# Patient Record
Sex: Female | Born: 1965 | Hispanic: Yes | Marital: Married | Smoking: Never smoker
Health system: Southern US, Community
[De-identification: ages and names within clinical notes are randomized; demographics above are authoritative.]

## PROBLEM LIST (undated history)

## (undated) DIAGNOSIS — N950 Postmenopausal bleeding: Secondary | ICD-10-CM

## (undated) DIAGNOSIS — K219 Gastro-esophageal reflux disease without esophagitis: Secondary | ICD-10-CM

## (undated) DIAGNOSIS — Z8719 Personal history of other diseases of the digestive system: Secondary | ICD-10-CM

## (undated) DIAGNOSIS — N840 Polyp of corpus uteri: Secondary | ICD-10-CM

## (undated) HISTORY — PX: COLONOSCOPY WITH ESOPHAGOGASTRODUODENOSCOPY (EGD): SHX5779

## (undated) HISTORY — PX: BREAST BIOPSY: SHX20

---

## 2008-03-16 ENCOUNTER — Ambulatory Visit: Payer: Self-pay

## 2008-04-07 ENCOUNTER — Ambulatory Visit: Payer: Self-pay | Admitting: General Surgery

## 2008-04-12 ENCOUNTER — Ambulatory Visit: Payer: Self-pay | Admitting: General Surgery

## 2008-04-12 HISTORY — PX: BREAST EXCISIONAL BIOPSY: SUR124

## 2009-08-09 ENCOUNTER — Ambulatory Visit: Payer: Self-pay | Admitting: Family Medicine

## 2013-04-15 ENCOUNTER — Ambulatory Visit: Payer: Self-pay

## 2015-10-02 ENCOUNTER — Encounter: Payer: Self-pay | Admitting: Emergency Medicine

## 2015-10-02 ENCOUNTER — Ambulatory Visit
Admission: EM | Admit: 2015-10-02 | Discharge: 2015-10-02 | Disposition: A | Discharge status: Home / Self Care | Payer: BLUE CROSS/BLUE SHIELD | Attending: Internal Medicine | Admitting: Internal Medicine

## 2015-10-02 DIAGNOSIS — A09 Infectious gastroenteritis and colitis, unspecified: Secondary | ICD-10-CM | POA: Diagnosis not present

## 2015-10-02 DIAGNOSIS — R197 Diarrhea, unspecified: Secondary | ICD-10-CM

## 2015-10-02 MED ORDER — SULFAMETHOXAZOLE-TRIMETHOPRIM 800-160 MG PO TABS: 1.0000 | ORAL_TABLET | Freq: Two times a day (BID) | ORAL | Status: AC

## 2015-10-02 NOTE — ED Provider Notes (Addendum)
CSN: [id number]     Arrival date & time 10/02/15  1004 History   First MD Initiated Contact with Patient 10/02/15 1129     Chief Complaint  Patient presents with  . Diarrhea   HPI  Patient is a 49 year old lady who presents today with an 9 day history of diarrhea, persistent after eating some potato salad from the [employer] at Thanksgiving. It tasted off to her, so no one else ate any, and she only ate a bite or 2. She's continuing to have 3 or 4 watery stools a day, no blood. Crampy abdominal discomfort, relieved by bowel movement. Mild upper epigastric discomfort, with nausea. No vomiting. Not having incontinence or accidents on the way to the bathroom. Some headache associated with the abdominal discomfort. No cough. No urinary frequency, no dysuria, no unusual vaginal discharge or bleeding. Mild low back discomfort associated with abdominal cramping. No fever. History reviewed. No pertinent past medical history. History reviewed. No pertinent past surgical history. Family history:  hypertension Social History  Substance Use Topics  . Smoking status: Never Smoker   . Smokeless tobacco: None  . Alcohol Use: No    Review of Systems  All other systems reviewed and are negative.   Allergies  Review of patient's allergies indicates no known allergies.  Home Medications   Prior to Admission medications   Medication Sig Start Date End Date Taking? Authorizing Provider  bismuth subsalicylate (PEPTO BISMOL) 262 MG/15ML suspension Take 30 mLs by mouth every 6 (six) hours as needed.   Yes Historical Provider, MD           BP 137/86 mmHg  Pulse 57  Temp(Src) 98.1 F (36.7 C) (Oral)  Resp 16  Ht 5\' 2"  (1.575 m)  Wt 126 lb (57.153 kg)  BMI 23.04 kg/m2  SpO2 100%  LMP 09/18/2015 (Approximate)   Physical Exam  Constitutional: She is oriented to person, place, and time. No distress.  Alert, nicely groomed  HENT:  Head: Atraumatic.  Eyes:  Conjugate gaze, no eye  redness/drainage  Neck: Neck supple.  Cardiovascular: Normal rate and regular rhythm.   Pulmonary/Chest: No respiratory distress. She has no wheezes. She has no rales.  Lungs clear, symmetric breath sounds  Abdominal: Soft. She exhibits no distension. There is no tenderness. There is no rebound and no guarding.  Musculoskeletal: Normal range of motion. She exhibits no edema.  No leg swelling  Neurological: She is alert and oriented to person, place, and time.  Skin: Skin is warm and dry.  No cyanosis  Nursing note and vitals reviewed.   ED Course  Procedures (including critical care time)   MDM   1. Diarrhea of presumed infectious origin    Discharge Medication List as of 10/02/2015 11:46 AM    START taking these medications   Details  sulfamethoxazole-trimethoprim (BACTRIM DS,SEPTRA DS) 800-160 MG tablet Take 1 tablet by mouth 2 (two) times daily., Starting 10/02/2015, Until Sun 10/09/15, Normal       Stool sample for culture requested; put in as a future order. Discussed with patient collecting the sample before starting on the Bactrim. Recheck if not starting to improve in a few days, for bloody diarrhea, or for worsening abdominal pain or fever greater than 100.5. Anticipate gradual improvement in diarrhea over the next 2-3 weeks.    Eustace MooreLaura W Budzik, MD 10/02/15 1154  Eustace MooreLaura W Hare, MD 10/03/15 [phone]

## 2015-10-02 NOTE — Discharge Instructions (Signed)
Please bring in a stool sample for culture for food poisoning germs.  After collecting the sample, you can start on the prescription for bactrim (antibiotic) which was sent to the [facility] in [place]. Recheck if increasing diarrhea (frequency, or increased volume/wateriness), for bloody diarrhea, or for new fever >100.5 or worsening abdominal pain.

## 2015-10-02 NOTE — ED Notes (Signed)
Pt reports stomach pain and diarrhea since last week Friday, got better and then returned. Also headache. Nausea but no vomiting.

## 2015-10-03 DIAGNOSIS — A09 Infectious gastroenteritis and colitis, unspecified: Secondary | ICD-10-CM | POA: Diagnosis not present

## 2015-10-07 LAB — STOOL CULTURE

## 2015-10-10 NOTE — ED Notes (Signed)
Patient notified no pathogens present in stool culture

## 2015-11-03 ENCOUNTER — Other Ambulatory Visit: Payer: Self-pay | Admitting: Primary Care

## 2015-11-03 DIAGNOSIS — R1011 Right upper quadrant pain: Secondary | ICD-10-CM

## 2015-11-08 ENCOUNTER — Ambulatory Visit
Admission: RE | Admit: 2015-11-08 | Discharge: 2015-11-08 | Disposition: A | Discharge status: Home / Self Care | Payer: BLUE CROSS/BLUE SHIELD | Source: Ambulatory Visit | Attending: Primary Care | Admitting: Primary Care

## 2015-11-08 DIAGNOSIS — R1011 Right upper quadrant pain: Secondary | ICD-10-CM | POA: Diagnosis not present

## 2016-05-20 IMAGING — US US ABDOMEN COMPLETE
1 series · 14 of 25 positions shown · non-contrast
Comparison: None.

CLINICAL DATA: Right upper quadrant pain.

EXAM:
ABDOMEN ULTRASOUND COMPLETE

[Series 1: us abdomen complete · 0.17mm/px · 14 of 100 slices shown]
[im 1/100]
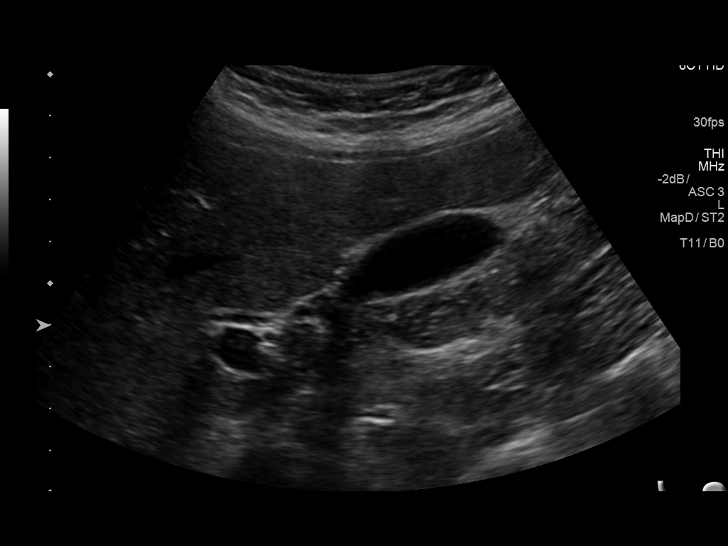
[im 9/100]
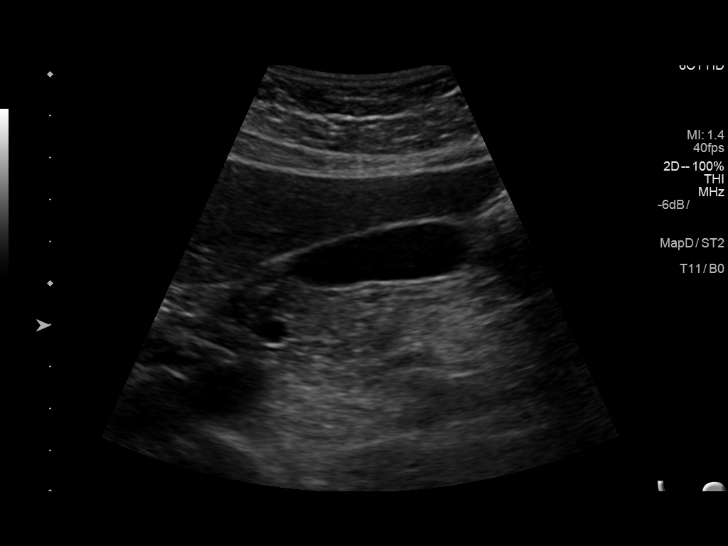
[im 17/100]
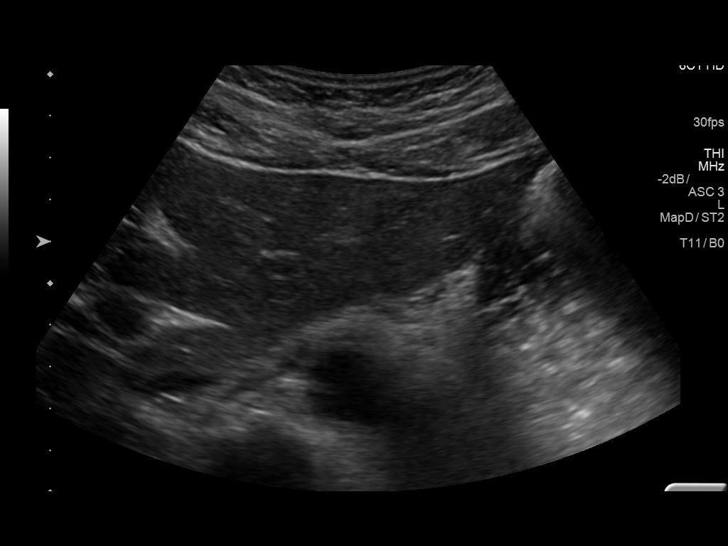
[im 25/100]
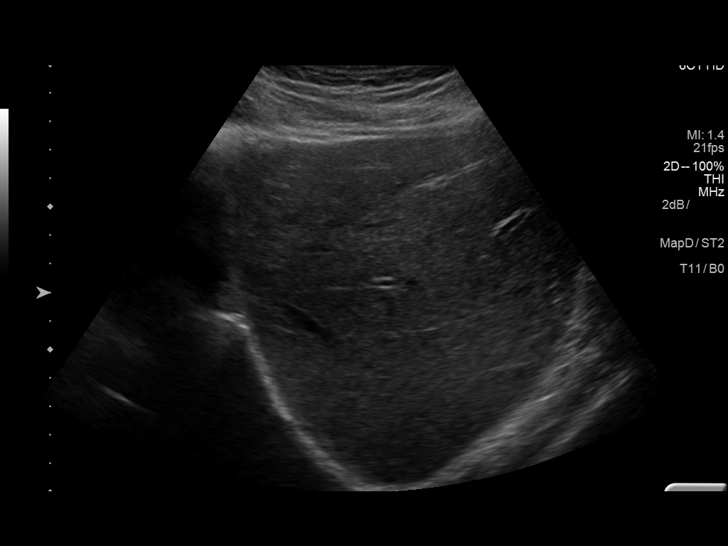
[im 34/100]
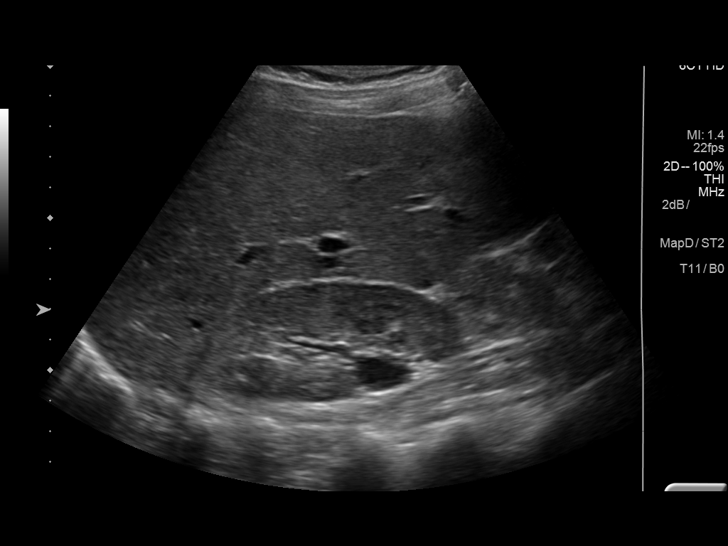
[im 38/100]
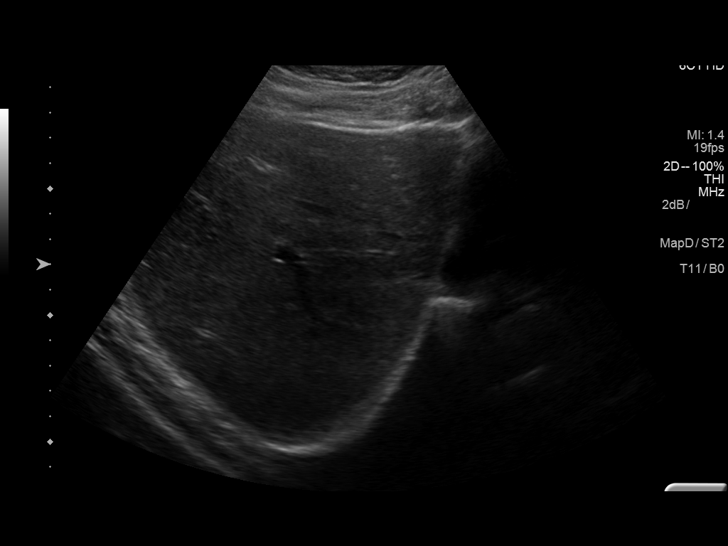
[im 46/100]
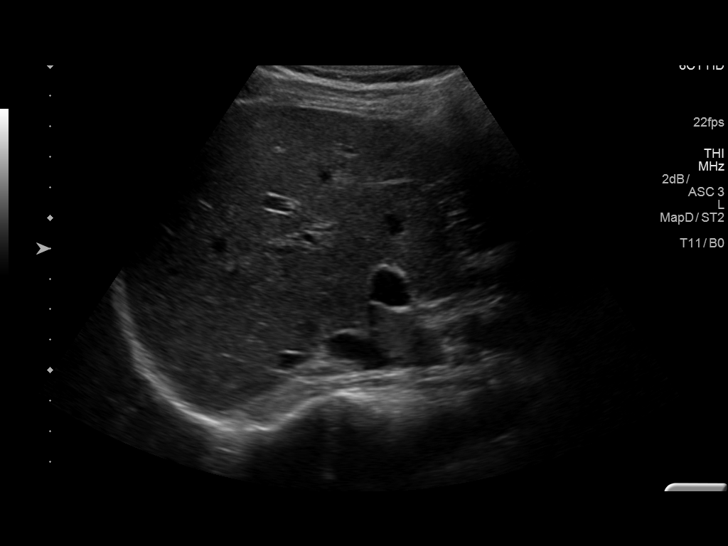
[im 54/100]
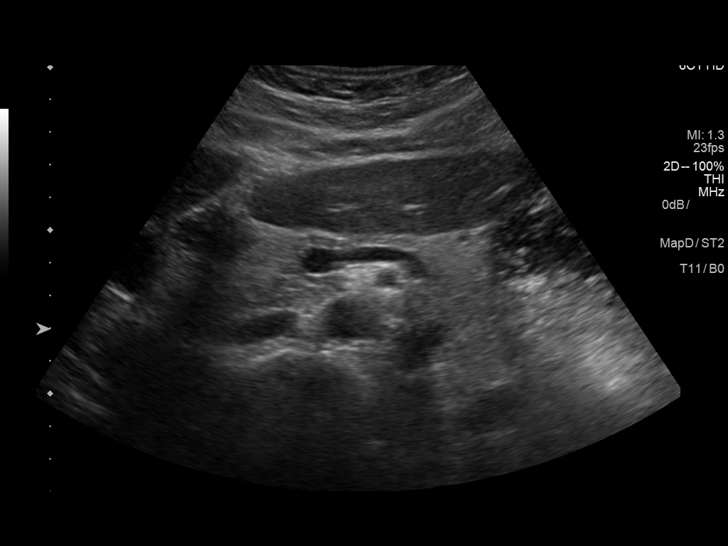
[im 62/100]
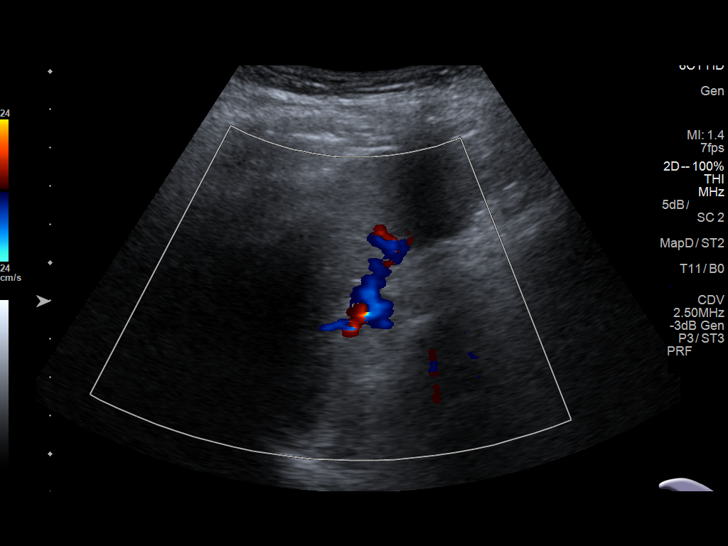
[im 67/100]
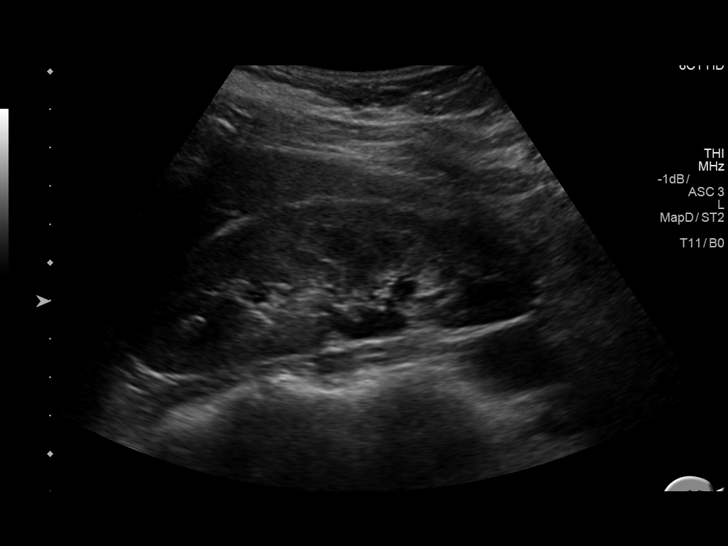
[im 75/100]
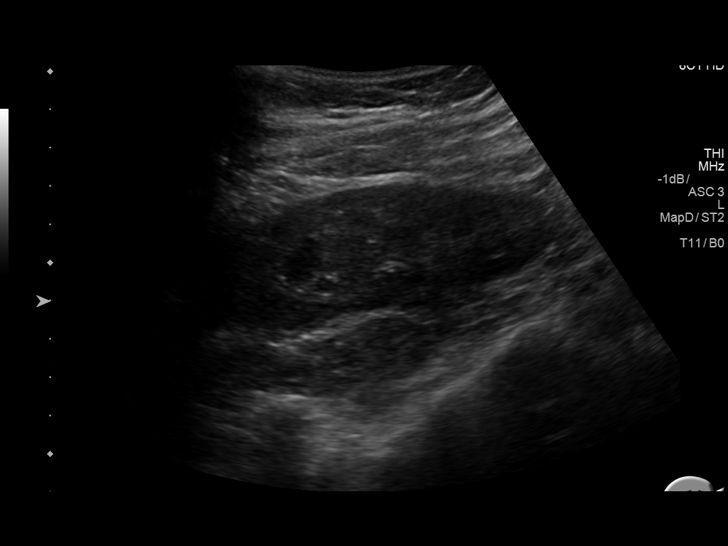
[im 83/100]
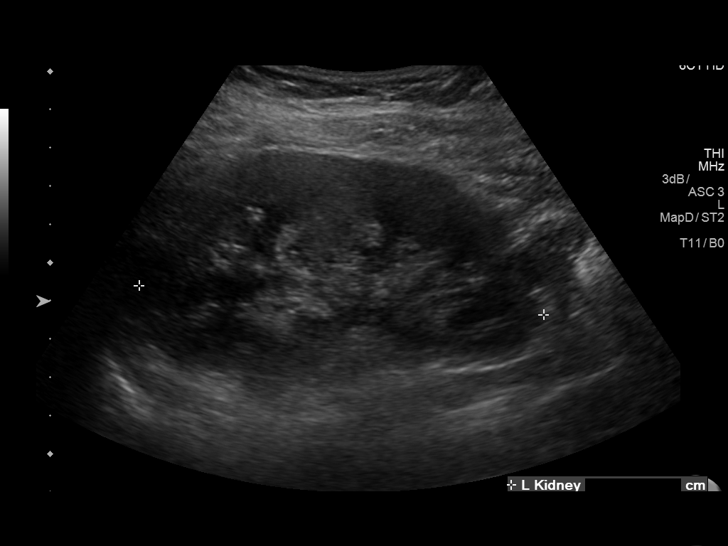
[im 91/100]
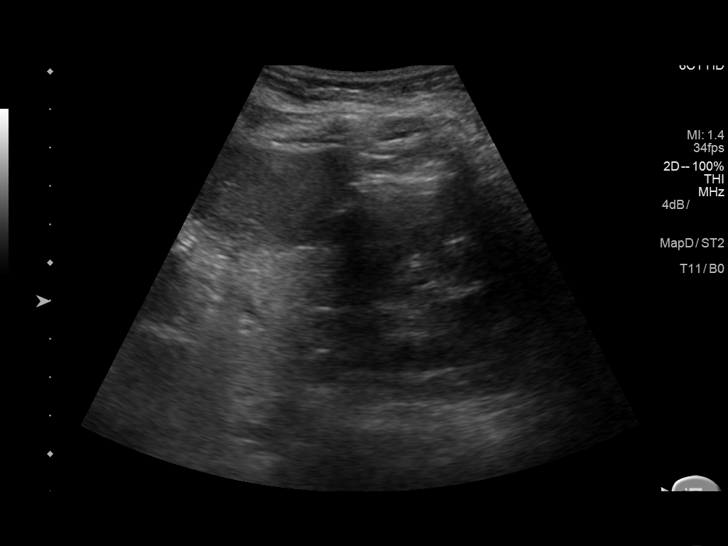
[im 100/100]
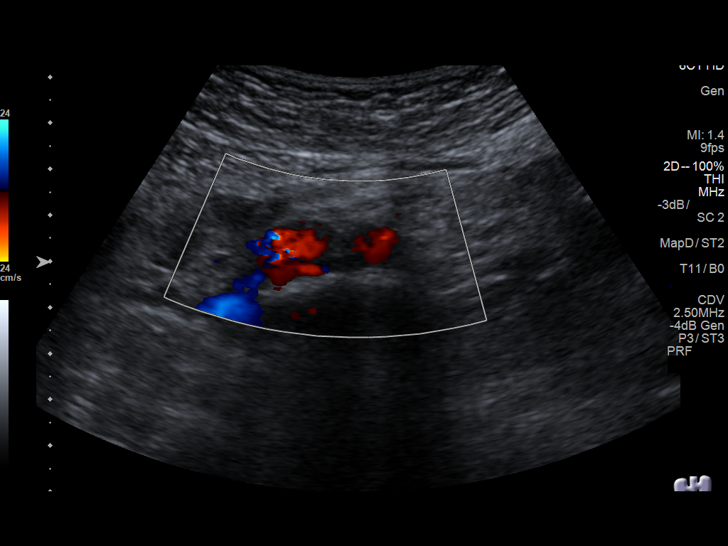

[14 of 25 positions shown; findings below may reference images not displayed]

FINDINGS: Gallbladder: No gallstones or wall thickening visualized. No
sonographic Murphy sign noted by sonographer.

Common bile duct: Diameter: 6 mm

Liver: No focal lesion identified. Within normal limits in
parenchymal echogenicity.

IVC: No abnormality visualized.

Pancreas: Visualized portion unremarkable.

Spleen: Size and appearance within normal limits.

Right Kidney: Length: 11.1 cm. Echogenicity within normal limits. No
mass or hydronephrosis visualized. Mild right renal pelvic
fullness/extrarenal pelvis noted .

Left Kidney: Length: 10.6 mm.. Echogenicity within normal limits. No
mass or hydronephrosis visualized.

Abdominal aorta: No aneurysm visualized.

Other findings: None.
IMPRESSION: Mild right renal pelvic fullness/extrarenal pelvis noted. No
caliectasis. No acute abnormality otherwise noted. No gallstones or
biliary distention.

## 2016-09-04 ENCOUNTER — Telehealth: Payer: Self-pay

## 2016-09-04 NOTE — Telephone Encounter (Signed)
L MOM TO CALL AND SCHEDULE APPT PER PCP

## 2016-09-10 NOTE — Progress Notes (Signed)
New Outpatient Visit Date: 09/11/2016  Referring Provider: Phineas Realharles Drew [facility] [address] [place], [place] [address]  Chief Complaint: Chest pain  HPI:  [name] is a 50 y.o. year-old female with no significant past medical history, who has been referred for evaluation of chest pain. She first noted vague discomfort in her chest about 5 weeks ago while planning a large party for her daughter. She initially attributed this to stress. However, after the party was over, she began waking up with left-sided chest pain. At times, the pain was sharp and other times pressure-like. She first noticed the pain after drinking orange juice and subsequently coughing vigorously. She experienced a sharp pain radiating from the upper chest to her back. The subsequent pain now frequently lasts several hours from the early morning to noontime. Its maximal intensity is 6/10. She has also noted some radiation to the left arm. She also endorses accompanying shortness of breath but denies nausea, vomiting, diaphoresis. She has used naproxen with transient improvement in the pain. She has tried intermittent omeprazole without significant benefit. The pain often begins at rest, she also notes some increased discomfort when "rushing" at work.  The patient also reports occasional fluttering in the chest that has been happening off and on for years. She does not have associated symptoms including chest pain and shortness of breath. She denies orthopnea, PND, and edema. She is never undergone prior cardiovascular evaluation. She denies personal history of cardiovascular disease. The patient does not consume caffeine on a regular basis.  --------------------------------------------------------------------------------------------------  Cardiovascular History & Procedures: Cardiovascular Problems:  Atypical chest pain  Risk Factors:  None  Cath/PCI:  None  CV Surgery:  None  EP  Procedures and Devices:  None  Non-Invasive Evaluation(s):  None  Recent CV Pertinent Labs: No results found for: CHOL, HDL, LDLCALC, LDLDIRECT, TRIG, CHOLHDL, INR, BNP, K, MG, BUN, CREATININE  --------------------------------------------------------------------------------------------------  History reviewed. No pertinent past medical history.  Past Surgical History:  Procedure Laterality Date  . BREAST BIOPSY      Outpatient Encounter Prescriptions as of 09/11/2016  Medication Sig  . naproxen sodium (ANAPROX) 220 MG tablet Take 220 mg by mouth as needed.  Marland Kitchen. omeprazole (PRILOSEC OTC) 20 MG tablet Take 20 mg by mouth daily.  . [DISCONTINUED] bismuth subsalicylate (PEPTO BISMOL) 262 MG/15ML suspension Take 30 mLs by mouth every 6 (six) hours as needed.   No facility-administered encounter medications on file as of 09/11/2016.     Allergies: Patient has no known allergies.  Social History   Social History  . Marital status: Married    Spouse name: N/A  . Number of children: N/A  . Years of education: N/A   Occupational History  . Not on file.   Social History Main Topics  . Smoking status: Never Smoker  . Smokeless tobacco: Never Used  . Alcohol use No  . Drug use: No  . Sexual activity: Not on file   Other Topics Concern  . Not on file   Social History Narrative  . No narrative on file    Family History  Problem Relation Age of Onset  . Hypertension Mother   . Hyperlipidemia Father   . Heart disease Father     Review of Systems: A 12-system review of systems was performed and was negative except as noted in the HPI.  --------------------------------------------------------------------------------------------------  Physical Exam: BP 122/78 (BP Location: Right Arm, Patient Position: Sitting, Cuff Size: Normal)   Pulse 65  Ht 5\' 2"  (1.575 m)   Wt 130 lb 8 oz (59.2 kg)   LMP 09/04/2016   BMI 23.87 kg/m   General:  Well-developed,  well-nourished woman seated comfortably on the exam table. HEENT: No conjunctival pallor or scleral icterus.  Moist mucous membranes.  OP clear. Neck: Supple without lymphadenopathy, thyromegaly, JVD, or HJR.  No carotid bruit. Lungs: Normal work of breathing.  Clear to auscultation bilaterally without wheezes or crackles. Heart: Regular rate and rhythm without murmurs, rubs, or gallops.  Non-displaced PMI. Abd: Bowel sounds present.  Soft, NT/ND without hepatosplenomegaly Ext: No lower extremity edema.  Radial, PT, and DP pulses are 2+ bilaterally Skin: warm and dry without rash Neuro: CNIII-XII intact.  Strength and fine-touch sensation intact in upper and lower extremities bilaterally. Psych: Normal mood and affect.  EKG:  Normal sinus rhythm without significant abnormalities.  No results found for: WBC, HGB, HCT, MCV, PLT  No results found for: NA, K, CL, CO2, BUN, CREATININE, GLUCOSE, ALT  No results found for: CHOL, HDL, LDLCALC, LDLDIRECT, TRIG, CHOLHDL   --------------------------------------------------------------------------------------------------  ASSESSMENT AND PLAN: Chest pain The pain is atypical, given that it frequently occurs at night and lasts several hours. The patient reports some exertional component as well as radiation to the left arm and accompanying shortness of breath. Overall, she is low risk for atherosclerotic cardiovascular disease. However, she is quite concerned about this potentially being related to her heart. We have therefore agreed to perform an exercise treadmill stress test. In the meantime, I have encouraged patient to continue using naproxen as needed as well as to take omeprazole 20 mg daily on a regular basis. We will contact her PCP to obtain her most recent labs, including lipid panel, for risk stratification. The patient will contact us if her symptoms worsen.  Shortness of breath and cough [name] notes that her chest pain began after an  episode of coughing and reported bronchitis. She continues to have occasional cough and dyspnea with her chest pain. We will therefore obtain a PA and lateral radiograph, given that her symptoms have persisted more than a month.  Follow-up: Return to clinic in 4-6 weeks.  Yvonne Kendallhristopher Carrel Leather, MD 09/11/2016 8:44 PM

## 2016-09-11 ENCOUNTER — Ambulatory Visit
Admission: RE | Admit: 2016-09-11 | Discharge: 2016-09-11 | Disposition: A | Discharge status: Home / Self Care | Payer: BLUE CROSS/BLUE SHIELD | Source: Ambulatory Visit | Attending: Internal Medicine | Admitting: Internal Medicine

## 2016-09-11 ENCOUNTER — Encounter: Payer: Self-pay | Admitting: Internal Medicine

## 2016-09-11 ENCOUNTER — Ambulatory Visit (INDEPENDENT_AMBULATORY_CARE_PROVIDER_SITE_OTHER): Payer: BLUE CROSS/BLUE SHIELD | Admitting: Internal Medicine

## 2016-09-11 VITALS — BP 122/78 | HR 65 | Ht 62.0 in | Wt 130.5 lb

## 2016-09-11 DIAGNOSIS — R0602 Shortness of breath: Secondary | ICD-10-CM

## 2016-09-11 DIAGNOSIS — J449 Chronic obstructive pulmonary disease, unspecified: Secondary | ICD-10-CM | POA: Insufficient documentation

## 2016-09-11 DIAGNOSIS — R079 Chest pain, unspecified: Secondary | ICD-10-CM

## 2016-09-11 DIAGNOSIS — R05 Cough: Secondary | ICD-10-CM

## 2016-09-11 DIAGNOSIS — R059 Cough, unspecified: Secondary | ICD-10-CM

## 2016-09-11 NOTE — Patient Instructions (Addendum)
Medication Instructions:  Your physician recommends that you continue on your current medications as directed. Please refer to the Current Medication list given to you today.   Testing/Procedures: A chest x-ray takes a picture of the organs and structures inside the chest, including the heart, lungs, and blood vessels. This test can show several things, including, whether the heart is enlarges; whether fluid is building up in the lungs; and whether pacemaker / defibrillator leads are still in place.  Your physician has requested that you have an exercise tolerance test. For further information please visit [email or web address]. Please also follow instruction sheet, as given.  Follow-Up: Your physician recommends that you schedule a follow-up appointment in: 4-6 WEEKS WITH DR END.     If you need a refill on your cardiac medications before your next appointment, please call your pharmacy.   Exercise Stress Electrocardiogram An exercise stress electrocardiogram is a test that is done to evaluate the blood supply to your heart. This test may also be called exercise stress electrocardiography. The test is done while you are walking on a treadmill. The goal of this test is to raise your heart rate. This test is done to find areas of poor blood flow to the heart by determining the extent of coronary artery disease (CAD). CAD is defined as narrowing in one or more heart (coronary) arteries of more than 70%. If you have an abnormal test result, this may mean that you are not getting adequate blood flow to your heart during exercise. Additional testing may be needed to understand why your test was abnormal. Tell a health care provider about:  Any allergies you have.  All medicines you are taking, including vitamins, herbs, eye drops, creams, and over-the-counter medicines.  Any problems you or family members have had with anesthetic medicines.  Any blood disorders you have.  Any surgeries you  have had.  Any medical conditions you have.  Possibility of pregnancy, if this applies. What are the risks? Generally, this is a safe procedure. However, as with any procedure, complications can occur. Possible complications can include:  Pain or pressure in the following areas:  Chest.  Jaw or neck.  Between your shoulder blades.  Radiating down your left arm.  Dizziness or light-headedness.  Shortness of breath.  Increased or irregular heartbeats.  Nausea or vomiting.  Heart attack (rare). What happens before the procedure?  Avoid all forms of caffeine 24 hours before your test or as directed by your health care provider. This includes coffee, tea (even decaffeinated tea), caffeinated sodas, chocolate, cocoa, and certain pain medicines.  Follow your health care provider's instructions regarding eating and drinking before the test.  Take your medicines as directed at regular times with water unless instructed otherwise. Exceptions may include:  If you have diabetes, ask how you are to take your insulin or pills. It is common to adjust insulin dosing the morning of the test.  If you are taking beta-blocker medicines, it is important to talk to your health care provider about these medicines well before the date of your test. Taking beta-blocker medicines may interfere with the test. In some cases, these medicines need to be changed or stopped 24 hours or more before the test.  If you wear a nitroglycerin patch, it may need to be removed prior to the test. Ask your health care provider if the patch should be removed before the test.  If you use an inhaler for any breathing condition, bring it with you to the  test.  If you are an outpatient, bring a snack so you can eat right after the stress phase of the test.  Do not smoke for 4 hours prior to the test or as directed by your health care provider.  Do not apply lotions, powders, creams, or oils on your chest prior to the  test.  Wear loose-fitting clothes and comfortable shoes for the test. This test involves walking on a treadmill. What happens during the procedure?  Multiple patches (electrodes) will be put on your chest. If needed, small areas of your chest may have to be shaved to get better contact with the electrodes. Once the electrodes are attached to your body, multiple wires will be attached to the electrodes and your heart rate will be monitored.  Your heart will be monitored both at rest and while exercising.  You will walk on a treadmill. The treadmill will be started at a slow pace. The treadmill speed and incline will gradually be increased to raise your heart rate. What happens after the procedure?  Your heart rate and blood pressure will be monitored after the test.  You may return to your normal schedule including diet, activities, and medicines, unless your health care provider tells you otherwise. This information is not intended to replace advice given to you by your health care provider. Make sure you discuss any questions you have with your health care provider. Document Released: 10/12/2000 Document Revised: 03/22/2016 Document Reviewed: 06/22/2013 Elsevier Interactive Patient Education  2017 [employer]

## 2016-09-17 ENCOUNTER — Ambulatory Visit (INDEPENDENT_AMBULATORY_CARE_PROVIDER_SITE_OTHER): Payer: BLUE CROSS/BLUE SHIELD

## 2016-09-17 DIAGNOSIS — R079 Chest pain, unspecified: Secondary | ICD-10-CM

## 2016-09-17 DIAGNOSIS — R0602 Shortness of breath: Secondary | ICD-10-CM

## 2016-09-18 LAB — EXERCISE TOLERANCE TEST
CSEPEDS: 42 s
CSEPEW: 9.6 METS
CSEPHR: 93 %
CSEPPHR: 160 {beats}/min
Exercise duration (min): 7 min
MPHR: 171 {beats}/min
Rest HR: 98 {beats}/min

## 2016-09-19 ENCOUNTER — Telehealth: Payer: Self-pay | Admitting: Internal Medicine

## 2016-09-19 NOTE — Telephone Encounter (Signed)
I spoke with the patient regarding the results of her ETT, which was intermediate risk with 1 mm ST depressions in V4-6.  However, significant baseline artifact during stress was noted, limiting sensitivity and specificity.  Given that the patient did not have any exertional symptoms, coupled with her lack of risk factors, I have a low suspicion for obstructive CAD.  Since our visit, [name] reports that her pain is much improved.  She believes that the pain may have been related to her lungs and is planning to undergo further evaluation of this.  We discussed repeating stress test with exercise stress echo versus monitoring her symptoms and have agreed to the latter.  The patient will follow-up with us as previously scheduled on 11/03/15.  She will contact us in the meantime her symptoms worsen.  Yvonne Kendallhristopher Annitta Fifield, MD [facility] Pager: [phone]

## 2016-11-02 ENCOUNTER — Ambulatory Visit: Payer: BLUE CROSS/BLUE SHIELD | Admitting: Internal Medicine

## 2017-07-25 LAB — HM PAP SMEAR

## 2018-01-17 ENCOUNTER — Encounter: Payer: Self-pay | Admitting: Family Medicine

## 2018-01-17 LAB — HM COLONOSCOPY

## 2018-01-20 ENCOUNTER — Other Ambulatory Visit: Payer: Self-pay | Admitting: Internal Medicine

## 2018-01-20 DIAGNOSIS — R1011 Right upper quadrant pain: Secondary | ICD-10-CM

## 2018-01-27 ENCOUNTER — Encounter: Payer: Self-pay | Admitting: Family Medicine

## 2018-01-27 ENCOUNTER — Ambulatory Visit (INDEPENDENT_AMBULATORY_CARE_PROVIDER_SITE_OTHER): Payer: BLUE CROSS/BLUE SHIELD | Admitting: Family Medicine

## 2018-01-27 VITALS — BP 124/76 | HR 73 | Temp 98.7°F | Resp 16 | Ht 62.0 in | Wt 129.0 lb

## 2018-01-27 DIAGNOSIS — N951 Menopausal and female climacteric states: Secondary | ICD-10-CM | POA: Diagnosis not present

## 2018-01-27 DIAGNOSIS — R1011 Right upper quadrant pain: Secondary | ICD-10-CM

## 2018-01-27 DIAGNOSIS — N926 Irregular menstruation, unspecified: Secondary | ICD-10-CM | POA: Diagnosis not present

## 2018-01-27 DIAGNOSIS — Z13220 Encounter for screening for lipoid disorders: Secondary | ICD-10-CM | POA: Diagnosis not present

## 2018-01-27 NOTE — Patient Instructions (Signed)
Menopause Menopause is the normal time of life when menstrual periods stop completely. Menopause is complete when you have missed 12 consecutive menstrual periods. It usually occurs between the ages of 48 years and 55 years. Very rarely does a woman develop menopause before the age of 40 years. At menopause, your ovaries stop producing the female hormones estrogen and progesterone. This can cause undesirable symptoms and also affect your health. Sometimes the symptoms may occur 4-5 years before the menopause begins. There is no relationship between menopause and:  Oral contraceptives.  Number of children you had.  Race.  The age your menstrual periods started (menarche).  Heavy smokers and very thin women may develop menopause earlier in life. What are the causes?  The ovaries stop producing the female hormones estrogen and progesterone. Other causes include:  Surgery to remove both ovaries.  The ovaries stop functioning for no known reason.  Tumors of the pituitary gland in the brain.  Medical disease that affects the ovaries and hormone production.  Radiation treatment to the abdomen or pelvis.  Chemotherapy that affects the ovaries.  What are the signs or symptoms?  Hot flashes.  Night sweats.  Decrease in sex drive.  Vaginal dryness and thinning of the vagina causing painful intercourse.  Dryness of the skin and developing wrinkles.  Headaches.  Tiredness.  Irritability.  Memory problems.  Weight gain.  Bladder infections.  Hair growth of the face and chest.  Infertility. More serious symptoms include:  Loss of bone (osteoporosis) causing breaks (fractures).  Depression.  Hardening and narrowing of the arteries (atherosclerosis) causing heart attacks and strokes.  How is this diagnosed?  When the menstrual periods have stopped for 12 straight months.  Physical exam.  Hormone studies of the blood. How is this treated? There are many treatment  choices and nearly as many questions about them. The decisions to treat or not to treat menopausal changes is an individual choice made with your health care provider. Your health care provider can discuss the treatments with you. Together, you can decide which treatment will work best for you. Your treatment choices may include:  Hormone therapy (estrogen and progesterone).  Non-hormonal medicines.  Treating the individual symptoms with medicine (for example antidepressants for depression).  Herbal medicines that may help specific symptoms.  Counseling by a psychiatrist or psychologist.  Group therapy.  Lifestyle changes including: ? Eating healthy. ? Regular exercise. ? Limiting caffeine and alcohol. ? Stress management and meditation.  No treatment.  Follow these instructions at home:  Take the medicine your health care provider gives you as directed.  Get plenty of sleep and rest.  Exercise regularly.  Eat a diet that contains calcium (good for the bones) and soy products (acts like estrogen hormone).  Avoid alcoholic beverages.  Do not smoke.  If you have hot flashes, dress in layers.  Take supplements, calcium, and vitamin D to strengthen bones.  You can use over-the-counter lubricants or moisturizers for vaginal dryness.  Group therapy is sometimes very helpful.  Acupuncture may be helpful in some cases. Contact a health care provider if:  You are not sure you are in menopause.  You are having menopausal symptoms and need advice and treatment.  You are still having menstrual periods after age 55 years.  You have pain with intercourse.  Menopause is complete (no menstrual period for 12 months) and you develop vaginal bleeding.  You need a referral to a specialist (gynecologist, psychiatrist, or psychologist) for treatment. Get help right   away if:  You have severe depression.  You have excessive vaginal bleeding.  You fell and think you have a  broken bone.  You have pain when you urinate.  You develop leg or chest pain.  You have a fast pounding heart beat (palpitations).  You have severe headaches.  You develop vision problems.  You feel a lump in your breast.  You have abdominal pain or severe indigestion. This information is not intended to replace advice given to you by your health care provider. Make sure you discuss any questions you have with your health care provider. Document Released: 01/05/2004 Document Revised: 03/22/2016 Document Reviewed: 05/14/2013 Elsevier Interactive Patient Education  2017 Elsevier Inc.  

## 2018-01-27 NOTE — Assessment & Plan Note (Signed)
Chronic and intermittent Followed by GI Undergoing HIDA scan later this week Benign colonoscopy and EGD recently Not taking any medications Discussed that if gallbladder is found to be malfunctioning, surgery will be the next step

## 2018-01-27 NOTE — Progress Notes (Signed)
Patient: Jeanette Murray, Female    DOB: 1965-12-06, 52 y.o.   MRN: [id number] Visit Date: 01/27/2018  Today's Provider: Shirlee Latch, MD   I, Joslyn Hy, CMA, am acting as scribe for Shirlee Latch, MD.  Chief Complaint  Patient presents with  . Establish Care   Subjective:    Establish Care Jeanette Murray is a 52 y.o. female who presents today to establish care. She feels well. She reports exercising 3 times a week. Walks for 20-30 minutes. She reports she is sleeping poorly. She states she wakes up throughout the night.  Patient is concerned about her irregular menstrual cycles.  She is always had regular menstrual cycles up until she had 2 periods within the last month.  She reports these were heavier flow than previously and she had more cramping than she had previously.  She had this happen once 2 years ago as well but none since.  Patient has chronic, intermittent right upper quadrant pain that sometimes radiates to her back.  She is being followed by [facility], Dr. Norma Fredrickson.  She had benign colonoscopy and EGD -results entered into the chart.  She is scheduled for a HIDA scan Wednesday.  She has been told that it is probably her gallbladder.  She wonders if it could be related to urinary issues.  Previously seen at [facility] -----------------------------------------------------------------   Review of Systems  Constitutional: Positive for fatigue and unexpected weight change. Negative for activity change, appetite change, chills, diaphoresis and fever.  HENT: Negative.   Eyes: Negative.   Respiratory: Negative.   Cardiovascular: Negative.   Gastrointestinal: Positive for abdominal distention. Negative for abdominal pain, anal bleeding, blood in stool, constipation, diarrhea, nausea, rectal pain and vomiting.  Endocrine: Positive for polyuria. Negative for cold intolerance, heat intolerance, polydipsia and polyphagia.  Genitourinary: Negative.     Musculoskeletal: Positive for arthralgias. Negative for back pain, gait problem, joint swelling, myalgias, neck pain and neck stiffness.  Skin: Negative.   Allergic/Immunologic: Positive for environmental allergies. Negative for food allergies and immunocompromised state.  Neurological: Negative.   Hematological: Negative.   Psychiatric/Behavioral: Positive for sleep disturbance. Negative for agitation, behavioral problems, confusion, decreased concentration, dysphoric mood, hallucinations, self-injury and suicidal ideas. The patient is nervous/anxious. The patient is not hyperactive.     Social History      She  reports that she has never smoked. She has never used smokeless tobacco. She reports that she does not drink alcohol or use drugs.       Social History   Socioeconomic History  . Marital status: Married    Spouse name: Not on file  . Number of children: Not on file  . Years of education: Not on file  . Highest education level: Not on file  Occupational History  . Not on file  Social Needs  . Financial resource strain: Not on file  . Food insecurity:    Worry: Not on file    Inability: Not on file  . Transportation needs:    Medical: Not on file    Non-medical: Not on file  Tobacco Use  . Smoking status: Never Smoker  . Smokeless tobacco: Never Used  Substance and Sexual Activity  . Alcohol use: No  . Drug use: No  . Sexual activity: Not on file  Lifestyle  . Physical activity:    Days per week: Not on file    Minutes per session: Not on file  . Stress: Not on  file  Relationships  . Social connections:    Talks on phone: Not on file    Gets together: Not on file    Attends religious service: Not on file    Active member of club or organization: Not on file    Attends meetings of clubs or organizations: Not on file    Relationship status: Not on file  Other Topics Concern  . Not on file  Social History Narrative  . Not on file    History reviewed. No  pertinent past medical history.   There are no active problems to display for this patient.   Past Surgical History:  Procedure Laterality Date  . BREAST BIOPSY Bilateral    benign calcification    Family History        Family Status  Relation Name Status  . Mother  Deceased  . Father  Deceased        Her family history includes Heart disease in her father; Hyperlipidemia in her father; Hypertension in her mother.      No Known Allergies  No current outpatient medications on file.   Patient Care Team: Erasmo Downer, MD as PCP - General (Family Medicine)      Objective:   Vitals: BP 124/76 (BP Location: Left Arm, Patient Position: Sitting, Cuff Size: Normal)   Pulse 73   Temp 98.7 F (37.1 C) (Oral)   Resp 16   Ht 5\' 2"  (1.575 m)   Wt 129 lb (58.5 kg)   LMP 01/13/2018   SpO2 97%   BMI 23.59 kg/m    Vitals:   01/27/18 1015  BP: 124/76  Pulse: 73  Resp: 16  Temp: 98.7 F (37.1 C)  TempSrc: Oral  SpO2: 97%  Weight: 129 lb (58.5 kg)  Height: 5\' 2"  (1.575 m)     Physical Exam  Constitutional: She is oriented to person, place, and time. She appears well-developed and well-nourished. No distress.  HENT:  Head: Normocephalic and atraumatic.  Right Ear: External ear normal.  Left Ear: External ear normal.  Nose: Nose normal.  Mouth/Throat: Oropharynx is clear and moist.  Eyes: Pupils are equal, round, and reactive to light. Conjunctivae and EOM are normal. No scleral icterus.  Neck: Neck supple. No thyromegaly present.  Cardiovascular: Normal rate, regular rhythm, normal heart sounds and intact distal pulses.  No murmur heard. Pulmonary/Chest: Effort normal and breath sounds normal. No respiratory distress. She has no wheezes. She has no rales.  Abdominal: Soft. Bowel sounds are normal. She exhibits no distension. There is no tenderness. There is no rebound and no guarding.  Musculoskeletal: She exhibits no edema or deformity.  Lymphadenopathy:     She has no cervical adenopathy.  Neurological: She is alert and oriented to person, place, and time.  Skin: Skin is warm and dry. No rash noted.  Psychiatric: She has a normal mood and affect. Her behavior is normal.  Vitals reviewed.    Depression Screen PHQ 2/9 Scores 01/27/2018  PHQ - 2 Score 0     Assessment & Plan:    Problem List Items Addressed This Visit      Other   Perimenopausal - Primary    Suspect patient's irregular menses is related to being perimenopausal Discussed with this means with the patient Patient to keep a log of her menses If she continues to have increased pain and irregular bleeding, could also consider whether she has fibroids Could obtain pelvic ultrasound to assess in the future, but  we will hold off at this point      Relevant Orders   TSH   RUQ pain    Chronic and intermittent Followed by GI Undergoing HIDA scan later this week Benign colonoscopy and EGD recently Not taking any medications Discussed that if gallbladder is found to be malfunctioning, surgery will be the next step       Other Visit Diagnoses    Irregular menses       Relevant Orders   TSH   CBC w/Diff/Platelet   Screening for lipid disorders       Relevant Orders   Comprehensive metabolic panel   Lipid panel       Return in about 1 year (around 01/28/2019) for physical.   The entirety of the information documented in the History of Present Illness, Review of Systems and Physical Exam were personally obtained by me. Portions of this information were initially documented by Irving BurtonEmily Ratchford, CMA and reviewed by me for thoroughness and accuracy.    Erasmo DownerBacigalupo, Zenola Dezarn M, MD, MPH [facility] 01/27/2018 11:04 AM

## 2018-01-27 NOTE — Assessment & Plan Note (Signed)
Suspect patient's irregular menses is related to being perimenopausal Discussed with this means with the patient Patient to keep a log of her menses If she continues to have increased pain and irregular bleeding, could also consider whether she has fibroids Could obtain pelvic ultrasound to assess in the future, but we will hold off at this point

## 2018-01-29 ENCOUNTER — Encounter
Admission: RE | Admit: 2018-01-29 | Discharge: 2018-01-29 | Disposition: A | Discharge status: Home / Self Care | Payer: BLUE CROSS/BLUE SHIELD | Source: Ambulatory Visit | Attending: Internal Medicine | Admitting: Internal Medicine

## 2018-01-29 DIAGNOSIS — R1011 Right upper quadrant pain: Secondary | ICD-10-CM | POA: Insufficient documentation

## 2018-01-31 ENCOUNTER — Telehealth: Payer: Self-pay | Admitting: Family Medicine

## 2018-01-31 NOTE — Telephone Encounter (Signed)
[phone] Received 9 pages back on 01/28/18

## 2018-02-01 ENCOUNTER — Encounter
Admission: RE | Admit: 2018-02-01 | Discharge: 2018-02-01 | Disposition: A | Discharge status: Home / Self Care | Payer: BLUE CROSS/BLUE SHIELD | Source: Ambulatory Visit | Attending: Internal Medicine | Admitting: Internal Medicine

## 2018-02-01 DIAGNOSIS — R1011 Right upper quadrant pain: Secondary | ICD-10-CM | POA: Insufficient documentation

## 2018-02-01 LAB — COMPREHENSIVE METABOLIC PANEL
ALBUMIN: 4.5 g/dL (ref 3.5–5.5)
ALT: 13 IU/L (ref 0–32)
AST: 17 IU/L (ref 0–40)
Albumin/Globulin Ratio: 1.8 (ref 1.2–2.2)
Alkaline Phosphatase: 64 IU/L (ref 39–117)
BUN / CREAT RATIO: 17 (ref 9–23)
BUN: 12 mg/dL (ref 6–24)
Bilirubin Total: 0.4 mg/dL (ref 0.0–1.2)
CALCIUM: 9.1 mg/dL (ref 8.7–10.2)
CO2: 21 mmol/L (ref 20–29)
CREATININE: 0.69 mg/dL (ref 0.57–1.00)
Chloride: 102 mmol/L (ref 96–106)
GFR, EST AFRICAN AMERICAN: 117 mL/min/{1.73_m2} (ref >59)
GFR, EST NON AFRICAN AMERICAN: 102 mL/min/{1.73_m2} (ref >59)
GLUCOSE: 80 mg/dL (ref 65–99)
Globulin, Total: 2.5 g/dL (ref 1.5–4.5)
Potassium: 4.5 mmol/L (ref 3.5–5.2)
Sodium: 137 mmol/L (ref 134–144)
TOTAL PROTEIN: 7 g/dL (ref 6.0–8.5)

## 2018-02-01 LAB — LIPID PANEL
Chol/HDL Ratio: 4 ratio (ref 0.0–4.4)
Cholesterol, Total: 166 mg/dL (ref 100–199)
HDL: 41 mg/dL (ref >39)
LDL CALC: 112 mg/dL — AB (ref 0–99)
Triglycerides: 67 mg/dL (ref 0–149)
VLDL Cholesterol Cal: 13 mg/dL (ref 5–40)

## 2018-02-01 LAB — CBC WITH DIFFERENTIAL/PLATELET
BASOS ABS: 0 10*3/uL (ref 0.0–0.2)
Basos: 0 %
EOS (ABSOLUTE): 0.1 10*3/uL (ref 0.0–0.4)
Eos: 1 %
HEMOGLOBIN: 12.7 g/dL (ref 11.1–15.9)
Hematocrit: 37.6 % (ref 34.0–46.6)
IMMATURE GRANS (ABS): 0 10*3/uL (ref 0.0–0.1)
IMMATURE GRANULOCYTES: 0 %
LYMPHS: 39 %
Lymphocytes Absolute: 2 10*3/uL (ref 0.7–3.1)
MCH: 30.6 pg (ref 26.6–33.0)
MCHC: 33.8 g/dL (ref 31.5–35.7)
MCV: 91 fL (ref 79–97)
MONOCYTES: 10 %
Monocytes Absolute: 0.5 10*3/uL (ref 0.1–0.9)
NEUTROS PCT: 50 %
Neutrophils Absolute: 2.6 10*3/uL (ref 1.4–7.0)
Platelets: 343 10*3/uL (ref 150–379)
RBC: 4.15 x10E6/uL (ref 3.77–5.28)
RDW: 12.8 % (ref 12.3–15.4)
WBC: 5.1 10*3/uL (ref 3.4–10.8)

## 2018-02-01 LAB — TSH: TSH: 2.28 u[IU]/mL (ref 0.450–4.500)

## 2018-02-01 MED ORDER — TECHNETIUM TC 99M MEBROFENIN IV KIT
5.3200 | PACK | Freq: Once | INTRAVENOUS | Status: AC | PRN
  Administered 2018-02-01: 5.32 via INTRAVENOUS

## 2018-02-03 ENCOUNTER — Telehealth: Payer: Self-pay

## 2018-02-03 NOTE — Telephone Encounter (Signed)
-----   Message from Erasmo DownerAngela M Bacigalupo, MD sent at 02/03/2018  8:21 AM EDT ----- Normal thyroid function, blood counts, blood sugar, kidney function, liver function, electrolytes.  Cholesterol is slightly elevated.  Recommend a diet low in saturated fats and regular exercise.  Erasmo DownerBacigalupo, Angela M, MD, MPH [facility] 02/03/2018 8:21 AM

## 2018-02-03 NOTE — Telephone Encounter (Signed)
Left message advising pt. OK per DPR. 

## 2018-05-22 ENCOUNTER — Other Ambulatory Visit: Payer: Self-pay | Admitting: Family Medicine

## 2018-05-22 DIAGNOSIS — Z1231 Encounter for screening mammogram for malignant neoplasm of breast: Secondary | ICD-10-CM

## 2018-06-20 ENCOUNTER — Other Ambulatory Visit: Payer: Self-pay | Admitting: Family Medicine

## 2018-06-20 ENCOUNTER — Ambulatory Visit
Admission: RE | Admit: 2018-06-20 | Discharge: 2018-06-20 | Disposition: A | Discharge status: Home / Self Care | Payer: BLUE CROSS/BLUE SHIELD | Source: Ambulatory Visit | Attending: Family Medicine | Admitting: Family Medicine

## 2018-06-20 DIAGNOSIS — R928 Other abnormal and inconclusive findings on diagnostic imaging of breast: Secondary | ICD-10-CM

## 2018-06-20 DIAGNOSIS — Z1231 Encounter for screening mammogram for malignant neoplasm of breast: Secondary | ICD-10-CM | POA: Insufficient documentation

## 2018-07-15 ENCOUNTER — Telehealth: Payer: Self-pay | Admitting: Family Medicine

## 2018-07-15 NOTE — Telephone Encounter (Signed)
Pt called [facility] that [facility].Order faxed to [phone].Phone [phone].Their office will contact pt [facility]

## 2018-08-01 LAB — HM MAMMOGRAPHY

## 2018-08-21 NOTE — Telephone Encounter (Signed)
OK. Will watch for results  Bacigalupo, Marzella Schlein, MD, MPH [facility] 08/21/2018 10:14 AM

## 2018-08-21 NOTE — Telephone Encounter (Signed)
Pt had diagnostic mammogram done at [facility] at request of patient on 08/01/18.I spoke to their office today 08/21/18 and was told that breast ultrasounds were not done.They will be sending report of mammogram to office.Please let me know if further test need to be scheduled

## 2018-11-28 ENCOUNTER — Ambulatory Visit (INDEPENDENT_AMBULATORY_CARE_PROVIDER_SITE_OTHER): Payer: Managed Care, Other (non HMO) | Admitting: Family Medicine

## 2018-11-28 ENCOUNTER — Encounter: Payer: Self-pay | Admitting: Family Medicine

## 2018-11-28 VITALS — BP 148/97 | HR 71 | Temp 98.3°F | Wt 132.6 lb

## 2018-11-28 DIAGNOSIS — R350 Frequency of micturition: Secondary | ICD-10-CM | POA: Diagnosis not present

## 2018-11-28 DIAGNOSIS — R8290 Unspecified abnormal findings in urine: Secondary | ICD-10-CM

## 2018-11-28 DIAGNOSIS — N951 Menopausal and female climacteric states: Secondary | ICD-10-CM | POA: Insufficient documentation

## 2018-11-28 DIAGNOSIS — M898X7 Other specified disorders of bone, ankle and foot: Secondary | ICD-10-CM

## 2018-11-28 DIAGNOSIS — M79641 Pain in right hand: Secondary | ICD-10-CM | POA: Diagnosis not present

## 2018-11-28 DIAGNOSIS — M79642 Pain in left hand: Secondary | ICD-10-CM

## 2018-11-28 LAB — POCT URINALYSIS DIPSTICK
Bilirubin, UA: NEGATIVE
Glucose, UA: NEGATIVE
Ketones, UA: NEGATIVE
LEUKOCYTES UA: NEGATIVE
NITRITE UA: NEGATIVE
PH UA: 6.5 (ref 5.0–8.0)
Protein, UA: NEGATIVE
RBC UA: NEGATIVE
Spec Grav, UA: <=1.005 — AB (ref 1.010–1.025)
UROBILINOGEN UA: 0.2 U/dL

## 2018-11-28 MED ORDER — CITALOPRAM HYDROBROMIDE 20 MG PO TABS: 20.0000 mg | ORAL_TABLET | Freq: Every day | ORAL | 3 refills | Status: DC

## 2018-11-28 NOTE — Patient Instructions (Signed)

## 2018-11-28 NOTE — Progress Notes (Signed)
Patient: Jeanette Murray Female    DOB: Mar 22, 1966   53 y.o.   MRN: [id number] Visit Date: 11/28/2018  Today's Provider: Shirlee Latch, MD   Chief Complaint  Patient presents with  . Female GU Problem   Subjective:    I, Presley Raddle, CMA, am acting as a scribe for Shirlee Latch, MD.    Female GU Problem  The patient's primary symptoms include missed menses. Primary symptoms comment: hot flashes, hand numbness, headaches, urinary frequency and urine odor. This is a new problem. The current episode started more than 1 month ago. The problem occurs constantly. The problem has been unchanged. Associated symptoms include frequency and headaches.   No dysuria or fever  LMP last month, but are irregular.  Should have started on 1/16, and worried about that.  Hot flashes are more frequent.  Previously at night and now occurring every day and during the day. Black cohosh and melatonin initially helped, but now worsening.  Hand pain in fingers bilaterally and balls of feet. Worse in AM for several weeks.  Seems to get better through the day.  Works as [employer] and carries laptop around all day.  No Known Allergies   Current Outpatient Medications:  .  glucosamine-chondroitin 500-400 MG tablet, Take 1 tablet by mouth daily., Disp: , Rfl:  .  pantoprazole (PROTONIX) 40 MG tablet, TK 1 T PO BID 30 MIN B MEALS, Disp: , Rfl:   Review of Systems  Constitutional: Positive for diaphoresis.  Respiratory: Negative.   Cardiovascular: Negative.   Genitourinary: Positive for frequency and missed menses.  Musculoskeletal: Negative.   Neurological: Positive for numbness and headaches.    Social History   Tobacco Use  . Smoking status: Never Smoker  . Smokeless tobacco: Never Used  Substance Use Topics  . Alcohol use: No      Objective:   BP (!) 148/97 (BP Location: Right Arm, Patient Position: Sitting, Cuff Size: Normal)   Pulse 71   Temp 98.3 F (36.8 C) (Oral)   Wt 132 lb 9.6  oz (60.1 kg)   SpO2 99%   BMI 24.25 kg/m  Vitals:   11/28/18 1449  BP: (!) 148/97  Pulse: 71  Temp: 98.3 F (36.8 C)  TempSrc: Oral  SpO2: 99%  Weight: 132 lb 9.6 oz (60.1 kg)     Physical Exam Vitals signs reviewed.  Constitutional:      General: She is not in acute distress.    Appearance: Normal appearance. She is not diaphoretic.  HENT:     Head: Normocephalic and atraumatic.  Eyes:     General: No scleral icterus.    Conjunctiva/sclera: Conjunctivae normal.  Neck:     Musculoskeletal: Neck supple.  Cardiovascular:     Rate and Rhythm: Normal rate and regular rhythm.     Pulses: Normal pulses.     Heart sounds: Normal heart sounds. No murmur.  Pulmonary:     Effort: Pulmonary effort is normal. No respiratory distress.     Breath sounds: Normal breath sounds. No wheezing or rhonchi.  Abdominal:     General: There is no distension.     Palpations: Abdomen is soft.     Tenderness: There is no abdominal tenderness.  Musculoskeletal: Normal range of motion.        General: No swelling, tenderness, deformity or signs of injury.     Right lower leg: No edema.     Left lower leg: No edema.  Comments: Loss of transverse arch in bilateral feet  Lymphadenopathy:     Cervical: No cervical adenopathy.  Skin:    General: Skin is warm and dry.     Capillary Refill: Capillary refill takes less than 2 seconds.     Findings: No rash.  Neurological:     General: No focal deficit present.     Mental Status: She is alert and oriented to person, place, and time.     Sensory: No sensory deficit.     Motor: No weakness.     Gait: Gait normal.  Psychiatric:        Mood and Affect: Mood normal.        Behavior: Behavior normal.     Results for orders placed or performed in visit on 11/28/18  POCT Urinalysis Dipstick  Result Value Ref Range   Color, UA light yellow    Clarity, UA clear    Glucose, UA Negative Negative   Bilirubin, UA negative    Ketones, UA negative      Spec Grav, UA <=1.005 (A) 1.010 - 1.025   Blood, UA negative    pH, UA 6.5 5.0 - 8.0   Protein, UA Negative Negative   Urobilinogen, UA 0.2 0.2 or 1.0 E.U./dL   Nitrite, UA negative    Leukocytes, UA Negative Negative   Appearance     Odor         Assessment & Plan   1. Urine frequency 2. Abnormal urine odor -New problem - UA is clear without signs of infection -UA does have changes specific gravity that may contribute to concentrated urine smell -Discussed importance of staying well-hydrated -Discussed return precautions - POCT Urinalysis Dipstick  3. Perimenopausal vasomotor symptoms -Patient continues to have the irregular periods -Discussed perimenopause and the progression of this - Given her ongoing vasomotor symptoms, we did discuss hormone replacement therapy and the risks and benefits -Patient would like to avoid hormone replacement therapy -We will instead treat with SSRI -Started on Celexa 20 mg daily -Discussed potential side effects -Discussed return precautions -Follow-up at next visit  4. Bilateral hand pain -New problem - Suspect she has some osteoarthritis -We will obtain hand x-rays to see degree -Discussed NSAIDs as needed - DG Hand Complete Left; Future - DG Hand Complete Right; Future  5. Pain in metatarsus, bilateral -Loss of transverse arch and bilateral feet -No other abnormalities on exam today -Suspect that her mild pain in the mornings in her feet is related to metatarsal irritation from the day before -Discussed NSAIDs as needed    Meds ordered this encounter  Medications  . citalopram (CELEXA) 20 MG tablet    Sig: Take 1 tablet (20 mg total) by mouth daily.    Dispense:  30 tablet    Refill:  3     Return in about 3 months (around 02/26/2019) for CPE.   The entirety of the information documented in the History of Present Illness, Review of Systems and Physical Exam were personally obtained by me. Portions of this information  were initially documented by Presley Raddle, CMA and reviewed by me for thoroughness and accuracy.    Erasmo Downer, MD, MPH [facility] 11/28/2018 4:52 PM

## 2019-01-30 ENCOUNTER — Encounter: Payer: BLUE CROSS/BLUE SHIELD | Admitting: Family Medicine

## 2019-03-04 ENCOUNTER — Telehealth: Payer: Self-pay

## 2019-03-04 NOTE — Telephone Encounter (Signed)
LMTCB for PHQ screening 

## 2020-02-09 IMAGING — NM NM HEPATO W/GB/PHARM/[PERSON_NAME]
2 series · 12 of 12 positions shown · non-contrast
Comparison: None.

CLINICAL DATA: Right upper quadrant pain.

EXAM:
NUCLEAR MEDICINE HEPATOBILIARY IMAGING WITH GALLBLADDER EF
TECHNIQUE: Sequential images of the abdomen were obtained [DATE] minutes
following intravenous administration of radiopharmaceutical. After
oral ingestion of Ensure, gallbladder ejection fraction was
determined. At 60 min, normal ejection fraction is greater than 33%.
RADIOPHARMACEUTICALS:  5.32 mCi 7c-OOm  Choletec IV

[Series 1000: hepatobiliary scan · 9.59mm/px · 6 of 60 frames shown]
[frame 6/60]
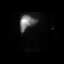
[frame 16/60]
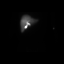
[frame 26/60]
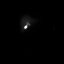
[frame 36/60]
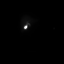
[frame 46/60]
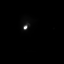
[frame 56/60]
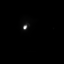

[Series 1000: gallbladder ef · 4.80mm/px · 6 of 120 frames shown]
[frame 11/120]
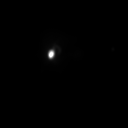
[frame 31/120]
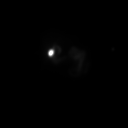
[frame 51/120]
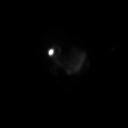
[frame 71/120]
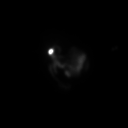
[frame 91/120]
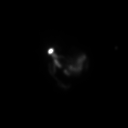
[frame 111/120]
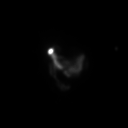

[12 of 12 positions shown; findings below may reference images not displayed]

FINDINGS: Prompt uptake and biliary excretion of activity by the liver is
seen. Gallbladder activity is visualized, consistent with patency of
cystic duct. Biliary activity passes into small bowel, consistent
with patent common bile duct.

Calculated gallbladder ejection fraction is 73%. (Normal gallbladder
ejection fraction with Ensure is greater than 33%.)
IMPRESSION: Normal study.  Normal gallbladder ejection fraction.

## 2020-03-24 ENCOUNTER — Ambulatory Visit
Admission: EM | Admit: 2020-03-24 | Discharge: 2020-03-24 | Disposition: A | Discharge status: Home / Self Care | Payer: Managed Care, Other (non HMO) | Attending: Emergency Medicine | Admitting: Emergency Medicine

## 2020-03-24 ENCOUNTER — Other Ambulatory Visit: Payer: Self-pay

## 2020-03-24 ENCOUNTER — Encounter: Payer: Self-pay | Admitting: Emergency Medicine

## 2020-03-24 DIAGNOSIS — H00016 Hordeolum externum left eye, unspecified eyelid: Secondary | ICD-10-CM | POA: Diagnosis not present

## 2020-03-24 MED ORDER — ERYTHROMYCIN 5 MG/GM OP OINT: 1.0000 "application " | TOPICAL_OINTMENT | Freq: Four times a day (QID) | OPHTHALMIC | 0 refills | Status: AC

## 2020-03-24 MED ORDER — CEPHALEXIN 500 MG PO CAPS: 500.0000 mg | ORAL_CAPSULE | Freq: Three times a day (TID) | ORAL | 0 refills | Status: AC

## 2020-03-24 NOTE — ED Provider Notes (Signed)
[facility] ____________________________________________  Time seen: Approximately 5:38 PM  I have reviewed the triage vital signs and the nursing notes.   HISTORY  Chief Complaint Stye   HPI Jeanette Murray is a 54 y.o. female presenting for evaluation of left eye stye.  Reports this is been present for approximately 6 weeks.  Reports initially she had a stye on her left lower eyelid and then followed by upper eyelid.  Was seen by eye doctor and was given oral antibiotics.  Reports the redness and swelling did go down, but reports the bump has never gone away.  Reports she has noticed some mild increase in redness in the last few days.  Has continued intermittent warm compresses.  Tried over-the-counter eyedrops without resolution.  Denies changes to vision.  Denies injury or trauma or foreign body sensation.  Denies light sensitivity.  Reports otherwise doing well.  No fevers.  No recent sickness otherwise.  Wears glasses, does not wear contacts.   History reviewed. No pertinent past medical history.  Patient Active Problem List   Diagnosis Date Noted  . Perimenopausal vasomotor symptoms 11/28/2018  . Perimenopausal 01/27/2018  . RUQ pain 01/27/2018    Past Surgical History:  Procedure Laterality Date  . BREAST BIOPSY Bilateral    benign calcification  . BREAST EXCISIONAL BIOPSY Right 04/12/2008   neg  . BREAST EXCISIONAL BIOPSY Left 04/12/2008   neg     No current facility-administered medications for this encounter.  Current Outpatient Medications:  .  citalopram (CELEXA) 20 MG tablet, Take 1 tablet (20 mg total) by mouth daily., Disp: 30 tablet, Rfl: 3 .  cephALEXin (KEFLEX) 500 MG capsule, Take 1 capsule (500 mg total) by mouth 3 (three) times daily for 7 days., Disp: 21 capsule, Rfl: 0 .  erythromycin ophthalmic ointment, Place 1 application into the left eye 4 (four) times daily for 5 days., Disp: 3.5 g, Rfl: 0 .  glucosamine-chondroitin 500-400 MG  tablet, Take 1 tablet by mouth daily., Disp: , Rfl:  .  pantoprazole (PROTONIX) 40 MG tablet, TK 1 T PO BID 30 MIN B MEALS, Disp: , Rfl:   Allergies Patient has no known allergies.  Family History  Problem Relation Age of Onset  . Hypertension Mother   . Peripheral Artery Disease Mother   . Hyperlipidemia Father   . Heart disease Father   . Coronary artery disease Father   . Ulcerative colitis Father   . Ulcerative colitis Sister   . Healthy Sister   . Healthy Sister   . Healthy Sister   . Healthy Sister     Social History Social History   Tobacco Use  . Smoking status: Never Smoker  . Smokeless tobacco: Never Used  Substance Use Topics  . Alcohol use: No  . Drug use: No    Review of Systems Constitutional: No fever/chills Eyes: No visual changes.  As above. ENT: No sore throat. Cardiovascular: Denies chest pain. Respiratory: Denies shortness of breath. Musculoskeletal: Negative for back pain. Skin: Negative for rash.   ____________________________________________   PHYSICAL EXAM:  VITAL SIGNS: ED Triage Vitals  Enc Vitals Group     BP 03/24/20 1712 (!) 142/100     Pulse Rate 03/24/20 1712 64     Resp 03/24/20 1712 18     Temp 03/24/20 1712 98.2 F (36.8 C)     Temp Source 03/24/20 1712 Oral     SpO2 03/24/20 1712 100 %     Weight 03/24/20 1710 136 lb (  61.7 kg)     Height 03/24/20 1710 5\' 2"  (1.575 m)     Head Circumference --      Peak Flow --      Pain Score 03/24/20 1710 4     Pain Loc --      Pain Edu? --      Excl. in [place]? --     Constitutional: Alert and oriented. Well appearing and in no acute distress. Eyes: Conjunctivae are normal. PERRL. EOMI. no pain with EOMs. Left lower eyelid on eyelid margin, hordeolum present with mild erythema, active purulent drainage on inner eyelid.  Left upper outer eyelid small area of palpable knot with mild erythema, nontender, no further surrounding erythema, no foreign bodies noted. ENT      Head:  Normocephalic and atraumatic. Respiratory: Normal respiratory effort without tachypnea nor retractions. Musculoskeletal: Steady gait.  Neurologic:  Normal speech and language. Speech is normal. No gait instability.  Skin:  Skin is warm, dry and intact. No rash noted. Psychiatric: Mood and affect are normal. Speech and behavior are normal. Patient exhibits appropriate insight and judgment   ___________________________________________   LABS (all labs ordered are listed, but only abnormal results are displayed)  Labs Reviewed - No data to display   PROCEDURES Procedures    INITIAL IMPRESSION / ASSESSMENT AND PLAN / ED COURSE  Pertinent labs & imaging results that were available during my care of the patient were reviewed by me and considered in my medical decision making (see chart for details).  Well-appearing patient.  No acute distress.  Patient reports she has had styes present for the last 6 weeks, had improved with antibiotics but starting to worsen again and have overall not resolved.  Discussed with patient stye versus chalazion.  Lower stye is noted to have some current drainage, encouraged to continue warm compresses.  Will treat with erythromycin ointment and oral Keflex.  Encourage patient to follow-up in 1 week with ophthalmology.  Discussed indication, risks and benefits of medications with patient. Discussed follow up and return parameters including no resolution or any worsening concerns. Patient verbalized understanding and agreed to plan.   ____________________________________________   FINAL CLINICAL IMPRESSION(S) / ED DIAGNOSES  Final diagnoses:  Hordeolum externum of left eye, unspecified eyelid     ED Discharge Orders         Ordered    erythromycin ophthalmic ointment  4 times daily     03/24/20 1731    cephALEXin (KEFLEX) 500 MG capsule  3 times daily     03/24/20 1731           Note: This dictation was prepared with Dragon dictation along with  smaller phrase technology. Any transcriptional errors that result from this process are unintentional.         Marylene Land, NP 03/24/20 1742

## 2020-03-24 NOTE — Discharge Instructions (Addendum)
Take medication as prescribed. Warm compresses. Monitor. Keep clean.   As discussed follow-up with ophthalmology in 1 week.  Follow up with your primary care physician this week as needed. Return to Urgent care for new or worsening concerns.

## 2020-03-24 NOTE — ED Triage Notes (Signed)
Patient c/o stye on her left eye that has been going on x 6 weeks. She states she has seen an eye doctor and she was given an antibiotic x 5 days but this did not resolve the stye.

## 2023-11-21 ENCOUNTER — Other Ambulatory Visit: Payer: Self-pay | Admitting: Obstetrics and Gynecology

## 2023-11-27 NOTE — H&P (Signed)
GYN H&P   CC: postmenopausal bleeding   HPI: Jeanette Murray 58 y.o. [phone] with no significant PMH who presents for f/u of postmenopausal bleeding.   Last seen on 10/31/23 for EMB.    Previously reported intermittent bleeding starting over the summer after starting HRT in April 2024. Discontinued HRT December 2024. Pelvic ultrasound 10/10/23 showed 3.21mm EMS. No bleeding since stopped HRT. Reports occasional hot flashes since stopping HRT but not bothersome.    PCP: Ashley Murrain RUMLEY, DO    ROS: All other systems reviewed and negative   PMHx: Past Medical History      Past Medical History:  Diagnosis Date   Allergic rhinitis     GERD (gastroesophageal reflux disease) 2018   Hemorrhoids          PSHx: Past Surgical History       Past Surgical History:  Procedure Laterality Date   COLONOSCOPY   01/17/2018    Normal colon/Repeat 54yrs/TKT   EGD   01/17/2018    Gastritis/No Repeat/TKT   BREAST BIOPSY Bilateral      excisional?, 2008, 2009        OBHx:                  OB History  Gravida Para Term Preterm AB Living  2 2 2     2   SAB IAB Ectopic Molar Multiple Live Births             2     # Outcome Date GA Lbr Len/2nd Weight Sex Type Anes PTL Lv  2 Term           Vag-Spont     LIV  1 Term           Vag-Spont     LIV      GYN Hx: - LMP: Patient's last menstrual period was 05/01/2020.            - Menopause: 54, on HRT- combined patch - Pap hx: Denies any history of abnormals, last 07/17/22 NILM with neg HRHPV. Never had any excisional procedures - STI hx: Denies any history of STIs  - Sexual preference: Sexually active with husband - Dyspareunia or sexual concerns: no - Abdominal surgeries: none - GYN procedures: none   FHx: Denies FHx of ovarian, breast, uterine, cervical, and colon cancer   Meds: Medications Ordered Prior to Encounter        Current Outpatient Medications on File Prior to Visit  Medication Sig Dispense Refill   cholecalciferol,  vitamin D3, (VITAMIN D3 ORAL) Take 1,000 Units by mouth once daily       fluticasone propionate (FLONASE) 50 mcg/actuation nasal spray Place 1 spray into both nostrils once daily as needed for Rhinitis       Lactobac no.41/Bifidobact no.7 (PROBIOTIC-10 ORAL) Take by mouth       omega-3/dha/epa/fish oil (OMEGA-3 FISH OIL ORAL) Take 1,200 mg by mouth once daily       multivitamin tablet Take 1 tablet by mouth once daily (Patient not taking: Reported on 11/21/2023)        No current facility-administered medications on file prior to visit.        Allergies: Allergies  No Known Allergies     SocHx: Social History  Social History         Tobacco Use   Smoking status: Never   Smokeless tobacco: Never   Tobacco comments:      N/A  Vaping Use   Vaping  status: Never Used  Substance Use Topics   Alcohol use: Never   Drug use: Never        OBJECTIVE: BP 129/67   Pulse 55   Ht 157.5 cm (5' 2.01")   Wt 59.6 kg (131 lb 6.4 oz)   LMP 05/01/2020   BMI 24.03 kg/m    Gen: NAD HEENT: [place]/AT Heart: Regular rate Lungs: Normal work of breathing Abdomen: soft, nontender, nondistended Ext: No BLE edema     Pelvic ultrasound- 10/10/23   Indication: Endovaginal imaging was necessary to evaluate the uterus and ovaries. Probe: [id number].   Uterus ======   Visualized. Size 80 mm x 51 mm x 40 mm Position: retroflexed Malformations: none Myometrium: myomatous Endometrium: appears normal/appropriate . Endometrial thickness, total 3.3 mm No polyps identified Fibroid(s) 1.  Size 15.00 mm x 13.00 mm x 13 mm. Mean 13.7 mm. Vol 1.3 cm. FIGO - 5 (subserosal but > 50% intramural). Right lateral wall        2.  Size 30.00 mm x 21.00 mm x 23 mm. Mean 24.7 mm. Vol 7.6 cm. FIGO - 7 (pedunculated). Fundal     Right Ovary =========   Visualized, Normal. Outline: Normal. Morphology: Appropriate. Size 23 mm x 12 mm x 14 mm No cysts identified No follicles identified   Left  Ovary ========   Visualized, Normal. Outline: Normal. Morphology: Appropriate. Size 19 mm x 10 mm x 8 mm No cysts identified No follicles identified   Cul de Sac =========   Normal. No Fluid Seen   Impression =========   Endovaginal sonogram was performed today due to the indications outlined above.   The sonogram reveals a uterus with two fibroids which are measured and described above.   The endometrium appears normal measuring 3.48mm.   The ovaries appear normal and there are no unusual adnexal findings.   No free fluid seen     ASSESSMENT/PLAN: Jeanette Murray 58 y.o. [phone] with no significant PMH who presents for f/u of postmenopausal bleeding. Scheduled for hysteroscopy, D&C, and Myosure polypectomy on 12/20/23.    #Postmenopausal bleeding - Differential: vaginal (atrophy), cervical (dysplasia), versus uterine (fibroids, polyps, hyperplasia, malignancy) - Exam wnl - Pap 07/17/22 NILM with neg HRHPV - Pelvic ultrasound 10/10/23: Uterus 8x5.1x4cm, 1.5 cm subserosal fibroid and 3 cm fundal pedunculated fibroid, EMS 3.44mm, normal ovaries.  - EMS 3.64mm reassuring however patient desired to proceed with EMB - EMB 10/31/23 showed a benign endometrial polyp, disorders proliferative phase endometrium, no hyperplasia or carcinoma. - Recommended proceeding with hysteroscopy, D&C, and Myosure polypectomy for polyp removal. Discussed planned procedure. Patient wants to proceed. - Discussed planned procedure, risks, and benefits. Risks such as infection, bleeding, organ damage (including uterine perforation), anesthesia complications, and need for possible blood products were discussed; patient will accept products in case of an emergency. We discussed the risks of a blood transfusion, such as a 1 in 1.2-1.4 million chance of contracting HIV, Hep C. Discussed possibility of needing to perform a laparoscopic procedure in the case of uterine perforation.   - Surgical and blood consent  signed - Pre-op orders placed- (ERAS protocol, no antibiotics indicated) - Rx tylenol, ibuprofen, and zofran PRN. Sent to pharmacy.    Jeriel Vivanco Roda Shutters, MD

## 2023-12-11 ENCOUNTER — Encounter
Admission: RE | Admit: 2023-12-11 | Discharge: 2023-12-11 | Disposition: A | Discharge status: Home / Self Care | Payer: 59 | Source: Ambulatory Visit | Attending: Obstetrics and Gynecology | Admitting: Obstetrics and Gynecology

## 2023-12-11 HISTORY — DX: Polyp of corpus uteri: N84.0

## 2023-12-11 HISTORY — DX: Gastro-esophageal reflux disease without esophagitis: K21.9

## 2023-12-11 HISTORY — DX: Personal history of other diseases of the digestive system: Z87.19

## 2023-12-11 HISTORY — DX: Postmenopausal bleeding: N95.0

## 2023-12-11 NOTE — Patient Instructions (Addendum)
Your procedure is scheduled on:12-20-23 Friday Report to the Registration Desk on the 1st floor of the [facility].Then proceed to the 2nd floor Surgery Desk To find out your arrival time, please call [phone] between 1PM - 3PM on:12-19-23 Thursday If your arrival time is 6:00 am, do not arrive before that time as the [facility] entrance doors do not open until 6:00 am.  REMEMBER: Instructions that are not followed completely may result in serious medical risk, up to and including death; or upon the discretion of your surgeon and anesthesiologist your surgery may need to be rescheduled.  Do not eat food after midnight the night before surgery.  No gum chewing or hard candies.  You may however, drink CLEAR liquids up to 2 hours before you are scheduled to arrive for your surgery. Do not drink anything within 2 hours of your scheduled arrival time.  Clear liquids include: - water  - apple juice without pulp - gatorade (not RED colors) - black coffee or tea (Do NOT add milk or creamers to the coffee or tea) Do NOT drink anything that is not on this list.  In addition, your doctor has ordered for you to drink the provided:  Ensure Pre-Surgery Clear Carbohydrate Drink  Drinking this carbohydrate drink up to two hours before surgery helps to reduce insulin resistance and improve patient outcomes. Please complete drinking 2 hours before scheduled arrival time.  One week prior to surgery:Stop NOW (12-11-23) Stop Anti-inflammatories (NSAIDS) such as Advil, Aleve, Ibuprofen, Motrin, Naproxen, Naprosyn and Aspirin based products such as Excedrin, Goody's Powder, BC Powder. Stop ANY OVER THE COUNTER supplements until after surgery (Collagen, Probiotic)  You may however, continue to take Tylenol if needed for pain up until the day of surgery.  Continue taking all of your other prescription medications up until the day of surgery.  Do NOT take any medication the day of surgery  No Alcohol  for 24 hours before or after surgery.  No Smoking including e-cigarettes for 24 hours before surgery.  No chewable tobacco products for at least 6 hours before surgery.  No nicotine patches on the day of surgery.  Do not use any "recreational" drugs for at least a week (preferably 2 weeks) before your surgery.  Please be advised that the combination of cocaine and anesthesia may have negative outcomes, up to and including death. If you test positive for cocaine, your surgery will be cancelled.  On the morning of surgery brush your teeth with toothpaste and water, you may rinse your mouth with mouthwash if you wish. Do not swallow any toothpaste or mouthwash.  Do not wear jewelry, make-up, hairpins, clips or nail polish.  For welded (permanent) jewelry: bracelets, anklets, waist bands, etc.  Please have this removed prior to surgery.  If it is not removed, there is a chance that hospital personnel will need to cut it off on the day of surgery.  Do not wear lotions, powders, or perfumes.   Do not shave body hair from the neck down 48 hours before surgery.  Contact lenses, hearing aids and dentures may not be worn into surgery.  Do not bring valuables to the hospital. [facility] is not responsible for any missing/lost belongings or valuables.   Notify your doctor if there is any change in your medical condition (cold, fever, infection).  Wear comfortable clothing (specific to your surgery type) to the hospital.  After surgery, you can help prevent lung complications by doing breathing exercises.  Take  deep breaths and cough every 1-2 hours. Your doctor may order a device called an Incentive Spirometer to help you take deep breaths. When coughing or sneezing, hold a pillow firmly against your incision with both hands. This is called "splinting." Doing this helps protect your incision. It also decreases belly discomfort.  If you are being admitted to the hospital overnight, leave your  suitcase in the car. After surgery it may be brought to your room.  In case of increased patient census, it may be necessary for you, the patient, to continue your postoperative care in the Same Day Surgery department.  If you are being discharged the day of surgery, you will not be allowed to drive home. You will need a responsible individual to drive you home and stay with you for 24 hours after surgery.   If you are taking public transportation, you will need to have a responsible individual with you.  Please call the Pre-admissions Testing Dept. at [phone] if you have any questions about these instructions.  Surgery Visitation Policy:  Patients having surgery or a procedure may have two visitors.  Children under the age of 62 must have an adult with them who is not the patient.  Temporary Visitor Restrictions Due to increasing cases of flu, RSV and COVID-19: Children ages 22 and under will not be able to visit patients in [facility] hospitals under most circumstances.

## 2023-12-17 ENCOUNTER — Encounter
Admission: RE | Admit: 2023-12-17 | Discharge: 2023-12-17 | Disposition: A | Discharge status: Home / Self Care | Payer: 59 | Source: Ambulatory Visit | Attending: Obstetrics and Gynecology | Admitting: Obstetrics and Gynecology

## 2023-12-17 DIAGNOSIS — Z01818 Encounter for other preprocedural examination: Secondary | ICD-10-CM

## 2023-12-17 DIAGNOSIS — Z01812 Encounter for preprocedural laboratory examination: Secondary | ICD-10-CM | POA: Insufficient documentation

## 2023-12-17 LAB — CBC
HCT: 41 % (ref 36.0–46.0)
Hemoglobin: 14.4 g/dL (ref 12.0–15.0)
MCH: 31.4 pg (ref 26.0–34.0)
MCHC: 35.1 g/dL (ref 30.0–36.0)
MCV: 89.5 fL (ref 80.0–100.0)
Platelets: 322 10*3/uL (ref 150–400)
RBC: 4.58 MIL/uL (ref 3.87–5.11)
RDW: 11.1 % — ABNORMAL LOW (ref 11.5–15.5)
WBC: 8.8 10*3/uL (ref 4.0–10.5)
nRBC: 0 % (ref 0.0–0.2)

## 2023-12-17 LAB — TYPE AND SCREEN
ABO/RH(D): O POS
Antibody Screen: NEGATIVE

## 2023-12-20 ENCOUNTER — Encounter: Payer: Self-pay | Admitting: Obstetrics and Gynecology

## 2023-12-20 ENCOUNTER — Other Ambulatory Visit: Payer: Self-pay

## 2023-12-20 ENCOUNTER — Ambulatory Visit: Payer: Self-pay | Admitting: Urgent Care

## 2023-12-20 ENCOUNTER — Ambulatory Visit: Payer: 59 | Admitting: Anesthesiology

## 2023-12-20 ENCOUNTER — Encounter
Admission: RE | Disposition: A | Discharge status: Home / Self Care | Payer: Self-pay | Source: Home / Self Care | Attending: Obstetrics and Gynecology

## 2023-12-20 ENCOUNTER — Ambulatory Visit
Admission: RE | Admit: 2023-12-20 | Discharge: 2023-12-20 | Disposition: A | Discharge status: Home / Self Care | Payer: 59 | Attending: Obstetrics and Gynecology | Admitting: Obstetrics and Gynecology

## 2023-12-20 DIAGNOSIS — K219 Gastro-esophageal reflux disease without esophagitis: Secondary | ICD-10-CM | POA: Insufficient documentation

## 2023-12-20 DIAGNOSIS — N840 Polyp of corpus uteri: Secondary | ICD-10-CM | POA: Insufficient documentation

## 2023-12-20 DIAGNOSIS — N950 Postmenopausal bleeding: Secondary | ICD-10-CM | POA: Insufficient documentation

## 2023-12-20 DIAGNOSIS — Z01818 Encounter for other preprocedural examination: Secondary | ICD-10-CM

## 2023-12-20 HISTORY — PX: DILATATION & CURETTAGE/HYSTEROSCOPY WITH MYOSURE: SHX6511

## 2023-12-20 LAB — ABO/RH: ABO/RH(D): O POS

## 2023-12-20 SURGERY — DILATATION & CURETTAGE/HYSTEROSCOPY WITH MYOSURE
Anesthesia: General

## 2023-12-20 MED ORDER — CHLORHEXIDINE GLUCONATE 0.12 % MT SOLN
15.0000 mL | Freq: Once | OROMUCOSAL | Status: AC
  Administered 2023-12-20: 15 mL via OROMUCOSAL

## 2023-12-20 MED ORDER — 0.9 % SODIUM CHLORIDE (POUR BTL) OPTIME
TOPICAL | Status: DC | PRN
  Administered 2023-12-20: 500 mL

## 2023-12-20 MED ORDER — ACETAMINOPHEN 500 MG PO TABS
1000.0000 mg | ORAL_TABLET | ORAL | Status: AC
  Administered 2023-12-20: 1000 mg via ORAL

## 2023-12-20 MED ORDER — LIDOCAINE-EPINEPHRINE 1 %-1:100000 IJ SOLN
INTRAMUSCULAR | Status: AC
  Filled 2023-12-20: qty 1

## 2023-12-20 MED ORDER — ACETAMINOPHEN 500 MG PO TABS
ORAL_TABLET | ORAL | Status: AC
  Filled 2023-12-20: qty 2

## 2023-12-20 MED ORDER — DEXAMETHASONE SODIUM PHOSPHATE 10 MG/ML IJ SOLN
INTRAMUSCULAR | Status: DC | PRN
  Administered 2023-12-20: 10 mg via INTRAVENOUS

## 2023-12-20 MED ORDER — POVIDONE-IODINE 10 % EX SWAB
2.0000 | Freq: Once | CUTANEOUS | Status: AC
  Administered 2023-12-20: 2 via TOPICAL

## 2023-12-20 MED ORDER — GABAPENTIN 300 MG PO CAPS
300.0000 mg | ORAL_CAPSULE | ORAL | Status: AC
  Administered 2023-12-20: 300 mg via ORAL

## 2023-12-20 MED ORDER — ONDANSETRON HCL 4 MG/2ML IJ SOLN
INTRAMUSCULAR | Status: DC | PRN
  Administered 2023-12-20: 4 mg via INTRAVENOUS

## 2023-12-20 MED ORDER — OXYCODONE HCL 5 MG/5ML PO SOLN: 5.0000 mg | Freq: Once | ORAL | Status: DC | PRN

## 2023-12-20 MED ORDER — LACTATED RINGERS IV SOLN: INTRAVENOUS | Status: DC

## 2023-12-20 MED ORDER — PROPOFOL 1000 MG/100ML IV EMUL
INTRAVENOUS | Status: AC
  Filled 2023-12-20: qty 100

## 2023-12-20 MED ORDER — OXYCODONE HCL 5 MG PO TABS: 5.0000 mg | ORAL_TABLET | Freq: Once | ORAL | Status: DC | PRN

## 2023-12-20 MED ORDER — SILVER NITRATE-POT NITRATE 75-25 % EX MISC
CUTANEOUS | Status: AC
  Filled 2023-12-20: qty 10

## 2023-12-20 MED ORDER — MIDAZOLAM HCL 2 MG/2ML IJ SOLN
INTRAMUSCULAR | Status: DC | PRN
  Administered 2023-12-20: 2 mg via INTRAVENOUS

## 2023-12-20 MED ORDER — FENTANYL CITRATE (PF) 100 MCG/2ML IJ SOLN
INTRAMUSCULAR | Status: AC
Start: 2023-12-20 — End: ?
  Filled 2023-12-20: qty 2

## 2023-12-20 MED ORDER — MIDAZOLAM HCL 2 MG/2ML IJ SOLN
INTRAMUSCULAR | Status: AC
  Filled 2023-12-20: qty 2

## 2023-12-20 MED ORDER — FENTANYL CITRATE (PF) 100 MCG/2ML IJ SOLN: 25.0000 ug | INTRAMUSCULAR | Status: DC | PRN

## 2023-12-20 MED ORDER — GABAPENTIN 300 MG PO CAPS
ORAL_CAPSULE | ORAL | Status: AC
  Filled 2023-12-20: qty 1

## 2023-12-20 MED ORDER — SODIUM CHLORIDE 0.9 % IR SOLN
Status: DC | PRN
  Administered 2023-12-20: 1545 mL

## 2023-12-20 MED ORDER — ORAL CARE MOUTH RINSE: 15.0000 mL | Freq: Once | OROMUCOSAL | Status: AC

## 2023-12-20 MED ORDER — PROPOFOL 10 MG/ML IV BOLUS
INTRAVENOUS | Status: DC | PRN
  Administered 2023-12-20: 100 mg via INTRAVENOUS

## 2023-12-20 MED ORDER — LIDOCAINE HCL (CARDIAC) PF 100 MG/5ML IV SOSY
PREFILLED_SYRINGE | INTRAVENOUS | Status: DC | PRN
  Administered 2023-12-20: 100 mg via INTRAVENOUS

## 2023-12-20 MED ORDER — CHLORHEXIDINE GLUCONATE 0.12 % MT SOLN
OROMUCOSAL | Status: AC
  Filled 2023-12-20: qty 15

## 2023-12-20 MED ORDER — FENTANYL CITRATE (PF) 100 MCG/2ML IJ SOLN
INTRAMUSCULAR | Status: AC
  Filled 2023-12-20: qty 2

## 2023-12-20 MED ORDER — FENTANYL CITRATE (PF) 100 MCG/2ML IJ SOLN
INTRAMUSCULAR | Status: DC | PRN
  Administered 2023-12-20 (×2): 25 ug via INTRAVENOUS

## 2023-12-20 SURGICAL SUPPLY — 21 items
APPLICATOR COTTON TIP 6 STRL (MISCELLANEOUS) IMPLANT
APPLICATOR COTTON TIP 6IN STRL (MISCELLANEOUS)
COVER PROBE FLX POLY STRL (MISCELLANEOUS) IMPLANT
DEVICE MYOSURE LITE (MISCELLANEOUS) IMPLANT
DEVICE MYOSURE REACH (MISCELLANEOUS) IMPLANT
DRSG TELFA 3X8 NADH STRL (GAUZE/BANDAGES/DRESSINGS) ×1 IMPLANT
GLOVE SURG SYN 6.5 ES PF (GLOVE) ×3
GLOVE SURG SYN 6.5 PF PI (GLOVE) ×2 IMPLANT
GOWN STRL REUS W/ TWL LRG LVL3 (GOWN DISPOSABLE) ×1 IMPLANT
IV NS IRRIG 3000ML ARTHROMATIC (IV SOLUTION) ×1 IMPLANT
KIT PROCEDURE FLUENT (KITS) IMPLANT
KIT TURNOVER CYSTO (KITS) ×1 IMPLANT
MANIFOLD NEPTUNE II (INSTRUMENTS) ×1 IMPLANT
PACK DNC HYST (MISCELLANEOUS) IMPLANT
SCRUB CHG 4% DYNA-HEX 4OZ (MISCELLANEOUS) ×1 IMPLANT
SEAL ROD LENS SCOPE MYOSURE (ABLATOR) ×1 IMPLANT
SYR CONTROL 10ML LL (SYRINGE) ×1 IMPLANT
TOWEL OR 17X26 4PK STRL BLUE (TOWEL DISPOSABLE) ×1 IMPLANT
TRAP FLUID SMOKE EVACUATOR (MISCELLANEOUS) ×1 IMPLANT
TUBING CONNECTING 10 (TUBING) IMPLANT
WATER STERILE IRR 500ML POUR (IV SOLUTION) ×1 IMPLANT

## 2023-12-20 NOTE — Op Note (Signed)
Operative Note   Name: Jeanette Murray MRN: [id number] Date: 12/20/2023  Preoperative diagnosis: Postmenopausal bleeding, endometrial polyp Postoperative diagnosis: Same   Procedure: Hysteroscopy, Dilation & Curettage, Myosure polypectomy Surgeon: Kathalene Frames, MD Anesthesia: General    EBL: 5cc IVF: 500cc  UOP: 255cc  Fluid deficit: 75cc   Complications: none  Specimens: endometrial currettings  Findings: Anteverted uterus that was sounded to 6cm, normal appearing cervix, normal appearing endocervical canal, uterine cavity atrophic with areas of erythema. Small flat endometrial polyp just beyond internal cervical os- removed. Bilateral ostia visualized.    Procedure:  The risks, benefits, indications, and alternative of the procedure were reviewed with the patient in pre-operative area and informed consent was obtained. The patient was taken to the operating room with IV in place. General anesthesia was administered. She was placed in the dorsal lithotomy position, prepped and draped in the usual sterile fashion. A red rubber catheter was used to drain the bladder. A sterile speculum was placed in the patient's vagina and the cervix visualized. A single tooth tenaculum was placed on the anterior lip of the cervix.The uterus was then gently sounded to 6 cm. The cervix was dilated to allow for hysteroscope using Pratt dilators. The hysteroscopy was done with normal saline with the above findings were noted. The Myosure was used to remove the endometrial polyp. The hysteroscopy was then removed. Tenaculum was removed and hemostasis was noted.   Specimen was sent to pathology. Vaginal instruments were removed. Hemostasis noted. The patient tolerated the procedure well and was taken to the recovery room in stable condition.   Signed by: Romana Juniper, Md, 12/20/23, 8:11 AM

## 2023-12-20 NOTE — Interval H&P Note (Signed)
History and Physical Interval Note:  12/20/2023 7:24 AM  Jeanette Murray  has presented today for surgery, with the diagnosis of Postmenopausal bleeding.  The various methods of treatment have been discussed with the patient and family. After consideration of risks, benefits and other options for treatment, the patient has consented to  Procedure(s): DILATATION & CURETTAGE/HYSTEROSCOPY WITH MYOSURE, POLYPECTOMY (N/A) as a surgical intervention.  The patient's history has been reviewed, patient examined, no change in status, stable for surgery.  I have reviewed the patient's chart and labs.  Questions were answered to the patient's satisfaction.     Addley Ballinger V Icholas Irby

## 2023-12-20 NOTE — Transfer of Care (Signed)
Immediate Anesthesia Transfer of Care Note  Patient: Jeanette Murray  Procedure(s) Performed: DILATATION & CURETTAGE/HYSTEROSCOPY WITH MYOSURE, POLYPECTOMY  Patient Location: PACU  Anesthesia Type:General  Level of Consciousness: drowsy  Airway & Oxygen Therapy: Patient Spontanous Breathing  Post-op Assessment: Report given to RN and Post -op Vital signs reviewed and stable  Post vital signs: stable with oral airway on room air.  Last Vitals:  Vitals Value Taken Time  BP    Temp    Pulse 57 12/20/23 0819  Resp 11 12/20/23 0819  SpO2    Vitals shown include unfiled device data.  Last Pain:  Vitals:   12/20/23 0624  TempSrc: Temporal  PainSc: 0-No pain         Complications: No notable events documented.

## 2023-12-20 NOTE — Anesthesia Preprocedure Evaluation (Signed)
Anesthesia Evaluation  Patient identified by MRN, date of birth, ID band Patient awake    Reviewed: Allergy & Precautions, NPO status , Patient's Chart, lab work & pertinent test results  History of Anesthesia Complications Negative for: history of anesthetic complications  Airway Mallampati: III  TM Distance: <3 FB Neck ROM: full    Dental  (+) Chipped   Pulmonary neg pulmonary ROS, neg shortness of breath   Pulmonary exam normal        Cardiovascular Exercise Tolerance: Good (-) angina negative cardio ROS Normal cardiovascular exam     Neuro/Psych negative neurological ROS  negative psych ROS   GI/Hepatic Neg liver ROS,GERD  Controlled,,  Endo/Other  negative endocrine ROS    Renal/GU      Musculoskeletal   Abdominal   Peds  Hematology negative hematology ROS (+)   Anesthesia Other Findings Past Medical History: No date: Endometrial polyp No date: GERD (gastroesophageal reflux disease) No date: History of gastritis No date: PMB (postmenopausal bleeding)  Past Surgical History: 04/12/2008: BREAST EXCISIONAL BIOPSY; Right     Comment:  neg 04/12/2008: BREAST EXCISIONAL BIOPSY; Left     Comment:  neg No date: COLONOSCOPY WITH ESOPHAGOGASTRODUODENOSCOPY (EGD)  BMI    Body Mass Index: 24.03 kg/m      Reproductive/Obstetrics negative OB ROS                             Anesthesia Physical Anesthesia Plan  ASA: 2  Anesthesia Plan: General LMA   Post-op Pain Management:    Induction: Intravenous  PONV Risk Score and Plan: Dexamethasone, Ondansetron, Midazolam and Treatment may vary due to age or medical condition  Airway Management Planned: LMA  Additional Equipment:   Intra-op Plan:   Post-operative Plan: Extubation in OR  Informed Consent: I have reviewed the patients History and Physical, chart, labs and discussed the procedure including the risks, benefits and  alternatives for the proposed anesthesia with the patient or authorized representative who has indicated his/her understanding and acceptance.     Dental Advisory Given  Plan Discussed with: Anesthesiologist, CRNA and Surgeon  Anesthesia Plan Comments: (Patient consented for risks of anesthesia including but not limited to:  - adverse reactions to medications - damage to eyes, teeth, lips or other oral mucosa - nerve damage due to positioning  - sore throat or hoarseness - Damage to heart, brain, nerves, lungs, other parts of body or loss of life  Patient voiced understanding and assent.)       Anesthesia Quick Evaluation

## 2023-12-20 NOTE — Anesthesia Procedure Notes (Signed)
Procedure Name: LMA Insertion Date/Time: 12/20/2023 7:43 AM  Performed by: Maryla Morrow., CRNAPre-anesthesia Checklist: Patient identified, Patient being monitored, Timeout performed, Emergency Drugs available and Suction available Patient Re-evaluated:Patient Re-evaluated prior to induction Oxygen Delivery Method: Circle system utilized Preoxygenation: Pre-oxygenation with 100% oxygen Induction Type: IV induction Ventilation: Mask ventilation without difficulty LMA: LMA inserted LMA Size: 3.0 Tube type: Oral Number of attempts: 1 Placement Confirmation: positive ETCO2 and breath sounds checked- equal and bilateral Tube secured with: Tape Dental Injury: Teeth and Oropharynx as per pre-operative assessment

## 2023-12-21 NOTE — Anesthesia Postprocedure Evaluation (Signed)
 Anesthesia Post Note  Patient: Jeanette Murray  Procedure(s) Performed: DILATATION & CURETTAGE/HYSTEROSCOPY WITH MYOSURE, POLYPECTOMY  Patient location during evaluation: PACU Anesthesia Type: General Level of consciousness: awake and alert Pain management: pain level controlled Vital Signs Assessment: post-procedure vital signs reviewed and stable Respiratory status: spontaneous breathing, nonlabored ventilation, respiratory function stable and patient connected to nasal cannula oxygen Cardiovascular status: blood pressure returned to baseline and stable Postop Assessment: no apparent nausea or vomiting Anesthetic complications: no   There were no known notable events for this encounter.   Last Vitals:  Vitals:   12/20/23 0848 12/20/23 0905  BP:  (!) 141/73  Pulse: 62 (!) 55  Resp: 16 16  Temp:  36.7 C  SpO2: 98% 98%    Last Pain:  Vitals:   12/20/23 0905  TempSrc: Tympanic  PainSc:                  Cleda Mccreedy Jakiera Ehler

## 2023-12-22 ENCOUNTER — Encounter: Payer: Self-pay | Admitting: Obstetrics and Gynecology

## 2023-12-23 LAB — SURGICAL PATHOLOGY
